# Patient Record
Sex: Female | Born: 1935 | Race: White | Hispanic: No | Smoking: Former smoker
Health system: Southern US, Community
[De-identification: ages and names within clinical notes are randomized; demographics above are authoritative.]

## PROBLEM LIST (undated history)

## (undated) DIAGNOSIS — E785 Hyperlipidemia, unspecified: Secondary | ICD-10-CM

## (undated) DIAGNOSIS — C801 Malignant (primary) neoplasm, unspecified: Secondary | ICD-10-CM

## (undated) DIAGNOSIS — E079 Disorder of thyroid, unspecified: Secondary | ICD-10-CM

## (undated) DIAGNOSIS — F419 Anxiety disorder, unspecified: Secondary | ICD-10-CM

## (undated) DIAGNOSIS — J40 Bronchitis, not specified as acute or chronic: Secondary | ICD-10-CM

## (undated) DIAGNOSIS — F319 Bipolar disorder, unspecified: Secondary | ICD-10-CM

## (undated) DIAGNOSIS — Z950 Presence of cardiac pacemaker: Secondary | ICD-10-CM

## (undated) DIAGNOSIS — D649 Anemia, unspecified: Secondary | ICD-10-CM

## (undated) DIAGNOSIS — F329 Major depressive disorder, single episode, unspecified: Secondary | ICD-10-CM

## (undated) DIAGNOSIS — I442 Atrioventricular block, complete: Secondary | ICD-10-CM

## (undated) DIAGNOSIS — I509 Heart failure, unspecified: Secondary | ICD-10-CM

## (undated) DIAGNOSIS — I10 Essential (primary) hypertension: Secondary | ICD-10-CM

## (undated) DIAGNOSIS — F0390 Unspecified dementia without behavioral disturbance: Secondary | ICD-10-CM

## (undated) DIAGNOSIS — F32A Depression, unspecified: Secondary | ICD-10-CM

## (undated) DIAGNOSIS — T7840XA Allergy, unspecified, initial encounter: Secondary | ICD-10-CM

## (undated) DIAGNOSIS — K219 Gastro-esophageal reflux disease without esophagitis: Secondary | ICD-10-CM

## (undated) DIAGNOSIS — I2510 Atherosclerotic heart disease of native coronary artery without angina pectoris: Secondary | ICD-10-CM

## (undated) HISTORY — DX: Allergy, unspecified, initial encounter: T78.40XA

## (undated) HISTORY — DX: Anemia, unspecified: D64.9

## (undated) HISTORY — DX: Malignant (primary) neoplasm, unspecified: C80.1

## (undated) HISTORY — DX: Hyperlipidemia, unspecified: E78.5

## (undated) HISTORY — PX: MASTECTOMY: SHX3

## (undated) HISTORY — DX: Major depressive disorder, single episode, unspecified: F32.9

## (undated) HISTORY — DX: Atrioventricular block, complete: I44.2

## (undated) HISTORY — DX: Presence of cardiac pacemaker: Z95.0

## (undated) HISTORY — DX: Disorder of thyroid, unspecified: E07.9

## (undated) HISTORY — PX: CORONARY ARTERY BYPASS GRAFT: SHX141

## (undated) HISTORY — PX: BREAST SURGERY: SHX581

## (undated) HISTORY — DX: Essential (primary) hypertension: I10

## (undated) HISTORY — DX: Anxiety disorder, unspecified: F41.9

## (undated) HISTORY — DX: Gastro-esophageal reflux disease without esophagitis: K21.9

## (undated) HISTORY — DX: Bronchitis, not specified as acute or chronic: J40

## (undated) HISTORY — DX: Bipolar disorder, unspecified: F31.9

## (undated) HISTORY — DX: Heart failure, unspecified: I50.9

## (undated) HISTORY — DX: Atherosclerotic heart disease of native coronary artery without angina pectoris: I25.10

## (undated) HISTORY — DX: Unspecified dementia, unspecified severity, without behavioral disturbance, psychotic disturbance, mood disturbance, and anxiety: F03.90

## (undated) HISTORY — PX: CHOLECYSTECTOMY: SHX55

## (undated) HISTORY — DX: Depression, unspecified: F32.A

---

## 1997-07-31 ENCOUNTER — Encounter: Admission: RE | Admit: 1997-07-31 | Discharge: 1997-07-31 | Payer: Self-pay | Admitting: Family Medicine

## 1997-08-18 ENCOUNTER — Encounter: Admission: RE | Admit: 1997-08-18 | Discharge: 1997-08-18 | Payer: Self-pay | Admitting: Family Medicine

## 1997-08-28 ENCOUNTER — Encounter: Admission: RE | Admit: 1997-08-28 | Discharge: 1997-08-28 | Payer: Self-pay | Admitting: Sports Medicine

## 1997-09-10 ENCOUNTER — Encounter: Admission: RE | Admit: 1997-09-10 | Discharge: 1997-09-10 | Payer: Self-pay | Admitting: Family Medicine

## 1998-01-22 ENCOUNTER — Encounter: Admission: RE | Admit: 1998-01-22 | Discharge: 1998-01-22 | Payer: Self-pay | Admitting: Family Medicine

## 1998-04-21 ENCOUNTER — Encounter: Admission: RE | Admit: 1998-04-21 | Discharge: 1998-04-21 | Payer: Self-pay | Admitting: Sports Medicine

## 1998-05-11 ENCOUNTER — Encounter: Admission: RE | Admit: 1998-05-11 | Discharge: 1998-05-11 | Payer: Self-pay | Admitting: Family Medicine

## 1998-05-20 ENCOUNTER — Encounter: Admission: RE | Admit: 1998-05-20 | Discharge: 1998-05-20 | Payer: Self-pay | Admitting: Family Medicine

## 1998-06-10 ENCOUNTER — Encounter: Admission: RE | Admit: 1998-06-10 | Discharge: 1998-06-10 | Payer: Self-pay | Admitting: Family Medicine

## 1998-06-12 ENCOUNTER — Encounter: Admission: RE | Admit: 1998-06-12 | Discharge: 1998-06-12 | Payer: Self-pay | Admitting: Family Medicine

## 1998-06-22 ENCOUNTER — Encounter: Admission: RE | Admit: 1998-06-22 | Discharge: 1998-06-22 | Payer: Self-pay | Admitting: Family Medicine

## 1998-07-21 ENCOUNTER — Encounter: Admission: RE | Admit: 1998-07-21 | Discharge: 1998-07-21 | Payer: Self-pay | Admitting: Family Medicine

## 1998-12-15 ENCOUNTER — Encounter: Admission: RE | Admit: 1998-12-15 | Discharge: 1998-12-15 | Payer: Self-pay | Admitting: Sports Medicine

## 1999-01-13 ENCOUNTER — Other Ambulatory Visit: Admission: RE | Admit: 1999-01-13 | Discharge: 1999-01-13 | Payer: Self-pay | Admitting: Gastroenterology

## 1999-01-13 ENCOUNTER — Encounter (INDEPENDENT_AMBULATORY_CARE_PROVIDER_SITE_OTHER): Payer: Self-pay | Admitting: Specialist

## 1999-01-26 ENCOUNTER — Encounter: Admission: RE | Admit: 1999-01-26 | Discharge: 1999-01-26 | Payer: Self-pay | Admitting: Family Medicine

## 1999-04-30 ENCOUNTER — Encounter: Admission: RE | Admit: 1999-04-30 | Discharge: 1999-04-30 | Payer: Self-pay | Admitting: Family Medicine

## 1999-05-17 ENCOUNTER — Encounter: Admission: RE | Admit: 1999-05-17 | Discharge: 1999-05-17 | Payer: Self-pay | Admitting: Family Medicine

## 1999-05-31 ENCOUNTER — Encounter: Admission: RE | Admit: 1999-05-31 | Discharge: 1999-05-31 | Payer: Self-pay | Admitting: Family Medicine

## 1999-06-07 ENCOUNTER — Encounter: Admission: RE | Admit: 1999-06-07 | Discharge: 1999-06-07 | Payer: Self-pay | Admitting: Family Medicine

## 1999-06-08 ENCOUNTER — Encounter: Payer: Self-pay | Admitting: Sports Medicine

## 1999-06-08 ENCOUNTER — Encounter: Admission: RE | Admit: 1999-06-08 | Discharge: 1999-06-08 | Payer: Self-pay | Admitting: Sports Medicine

## 1999-06-14 ENCOUNTER — Inpatient Hospital Stay (HOSPITAL_COMMUNITY): Admission: AD | Admit: 1999-06-14 | Discharge: 1999-06-15 | Payer: Self-pay | Admitting: Family Medicine

## 1999-06-14 ENCOUNTER — Encounter: Payer: Self-pay | Admitting: Family Medicine

## 1999-06-14 ENCOUNTER — Encounter: Admission: RE | Admit: 1999-06-14 | Discharge: 1999-06-14 | Payer: Self-pay | Admitting: Family Medicine

## 1999-06-23 ENCOUNTER — Encounter: Admission: RE | Admit: 1999-06-23 | Discharge: 1999-06-23 | Payer: Self-pay | Admitting: Family Medicine

## 1999-08-19 ENCOUNTER — Encounter: Admission: RE | Admit: 1999-08-19 | Discharge: 1999-08-19 | Payer: Self-pay | Admitting: Family Medicine

## 1999-08-23 ENCOUNTER — Inpatient Hospital Stay (HOSPITAL_COMMUNITY): Admission: AD | Admit: 1999-08-23 | Discharge: 1999-09-25 | Payer: Self-pay | Admitting: Family Medicine

## 1999-08-23 ENCOUNTER — Encounter: Admission: RE | Admit: 1999-08-23 | Discharge: 1999-08-23 | Payer: Self-pay | Admitting: Family Medicine

## 1999-08-23 ENCOUNTER — Encounter: Payer: Self-pay | Admitting: *Deleted

## 1999-08-25 ENCOUNTER — Encounter: Payer: Self-pay | Admitting: Family Medicine

## 1999-08-26 ENCOUNTER — Encounter: Payer: Self-pay | Admitting: Family Medicine

## 1999-08-27 ENCOUNTER — Encounter: Payer: Self-pay | Admitting: Family Medicine

## 1999-08-28 ENCOUNTER — Encounter: Payer: Self-pay | Admitting: Pulmonary Disease

## 1999-08-29 ENCOUNTER — Encounter: Payer: Self-pay | Admitting: Pulmonary Disease

## 1999-08-30 ENCOUNTER — Encounter: Payer: Self-pay | Admitting: Family Medicine

## 1999-08-31 ENCOUNTER — Encounter: Payer: Self-pay | Admitting: Family Medicine

## 1999-09-03 ENCOUNTER — Encounter: Payer: Self-pay | Admitting: Family Medicine

## 1999-09-04 ENCOUNTER — Encounter: Payer: Self-pay | Admitting: Family Medicine

## 1999-09-05 ENCOUNTER — Encounter: Payer: Self-pay | Admitting: Family Medicine

## 1999-09-06 ENCOUNTER — Ambulatory Visit (HOSPITAL_COMMUNITY): Admission: RE | Admit: 1999-09-06 | Discharge: 1999-09-06 | Payer: Self-pay | Admitting: Psychiatry

## 1999-09-08 ENCOUNTER — Encounter: Payer: Self-pay | Admitting: Family Medicine

## 1999-09-13 ENCOUNTER — Encounter: Payer: Self-pay | Admitting: Family Medicine

## 1999-09-14 ENCOUNTER — Encounter: Payer: Self-pay | Admitting: Family Medicine

## 1999-09-17 ENCOUNTER — Encounter: Payer: Self-pay | Admitting: Family Medicine

## 1999-09-22 ENCOUNTER — Encounter: Payer: Self-pay | Admitting: Family Medicine

## 1999-09-25 ENCOUNTER — Inpatient Hospital Stay: Admission: RE | Admit: 1999-09-25 | Discharge: 1999-10-01 | Payer: Self-pay | Admitting: Family Medicine

## 1999-09-26 ENCOUNTER — Encounter: Payer: Self-pay | Admitting: Family Medicine

## 1999-09-27 ENCOUNTER — Ambulatory Visit (HOSPITAL_COMMUNITY): Admission: RE | Admit: 1999-09-27 | Discharge: 1999-09-27 | Payer: Self-pay | Admitting: Family Medicine

## 1999-09-27 ENCOUNTER — Encounter: Payer: Self-pay | Admitting: Family Medicine

## 1999-10-15 ENCOUNTER — Encounter: Admission: RE | Admit: 1999-10-15 | Discharge: 1999-10-15 | Payer: Self-pay | Admitting: Family Medicine

## 1999-10-19 ENCOUNTER — Ambulatory Visit: Admission: RE | Admit: 1999-10-19 | Discharge: 1999-10-19 | Payer: Self-pay | Admitting: Gynecology

## 1999-10-22 ENCOUNTER — Ambulatory Visit (HOSPITAL_COMMUNITY): Admission: RE | Admit: 1999-10-22 | Discharge: 1999-10-22 | Payer: Self-pay | Admitting: Gynecology

## 2000-01-03 ENCOUNTER — Encounter: Admission: RE | Admit: 2000-01-03 | Discharge: 2000-01-03 | Payer: Self-pay | Admitting: Family Medicine

## 2000-03-15 ENCOUNTER — Encounter: Admission: RE | Admit: 2000-03-15 | Discharge: 2000-03-15 | Payer: Self-pay | Admitting: Family Medicine

## 2000-03-23 ENCOUNTER — Encounter: Payer: Self-pay | Admitting: Sports Medicine

## 2000-03-23 ENCOUNTER — Encounter: Admission: RE | Admit: 2000-03-23 | Discharge: 2000-03-23 | Payer: Self-pay | Admitting: Sports Medicine

## 2000-08-23 ENCOUNTER — Encounter: Admission: RE | Admit: 2000-08-23 | Discharge: 2000-08-23 | Payer: Self-pay | Admitting: Family Medicine

## 2000-09-06 ENCOUNTER — Encounter: Admission: RE | Admit: 2000-09-06 | Discharge: 2000-09-06 | Payer: Self-pay | Admitting: Family Medicine

## 2000-09-06 ENCOUNTER — Encounter: Payer: Self-pay | Admitting: Sports Medicine

## 2000-09-21 ENCOUNTER — Encounter: Admission: RE | Admit: 2000-09-21 | Discharge: 2000-09-21 | Payer: Self-pay | Admitting: Family Medicine

## 2000-09-25 ENCOUNTER — Encounter: Admission: RE | Admit: 2000-09-25 | Discharge: 2000-09-25 | Payer: Self-pay | Admitting: Sports Medicine

## 2000-09-25 ENCOUNTER — Encounter: Payer: Self-pay | Admitting: Sports Medicine

## 2000-10-19 ENCOUNTER — Encounter: Admission: RE | Admit: 2000-10-19 | Discharge: 2000-10-19 | Payer: Self-pay | Admitting: Family Medicine

## 2001-01-22 ENCOUNTER — Encounter: Admission: RE | Admit: 2001-01-22 | Discharge: 2001-01-22 | Payer: Self-pay | Admitting: Sports Medicine

## 2001-08-06 ENCOUNTER — Encounter: Admission: RE | Admit: 2001-08-06 | Discharge: 2001-08-06 | Payer: Self-pay | Admitting: Sports Medicine

## 2001-08-09 ENCOUNTER — Encounter: Payer: Self-pay | Admitting: Sports Medicine

## 2001-08-09 ENCOUNTER — Encounter: Admission: RE | Admit: 2001-08-09 | Discharge: 2001-08-09 | Payer: Self-pay | Admitting: Sports Medicine

## 2001-09-06 ENCOUNTER — Encounter: Admission: RE | Admit: 2001-09-06 | Discharge: 2001-09-06 | Payer: Self-pay | Admitting: Sports Medicine

## 2002-01-18 ENCOUNTER — Encounter: Admission: RE | Admit: 2002-01-18 | Discharge: 2002-01-18 | Payer: Self-pay | Admitting: Family Medicine

## 2002-05-15 ENCOUNTER — Encounter: Admission: RE | Admit: 2002-05-15 | Discharge: 2002-05-15 | Payer: Self-pay | Admitting: Family Medicine

## 2002-05-20 ENCOUNTER — Encounter: Admission: RE | Admit: 2002-05-20 | Discharge: 2002-05-20 | Payer: Self-pay | Admitting: Sports Medicine

## 2002-05-20 ENCOUNTER — Encounter: Payer: Self-pay | Admitting: Sports Medicine

## 2002-09-23 ENCOUNTER — Encounter: Admission: RE | Admit: 2002-09-23 | Discharge: 2002-09-23 | Payer: Self-pay | Admitting: Family Medicine

## 2002-10-18 ENCOUNTER — Encounter: Admission: RE | Admit: 2002-10-18 | Discharge: 2002-10-18 | Payer: Self-pay | Admitting: Family Medicine

## 2003-01-03 ENCOUNTER — Encounter: Admission: RE | Admit: 2003-01-03 | Discharge: 2003-01-03 | Payer: Self-pay | Admitting: Sports Medicine

## 2003-01-27 ENCOUNTER — Encounter: Admission: RE | Admit: 2003-01-27 | Discharge: 2003-01-27 | Payer: Self-pay | Admitting: Family Medicine

## 2003-03-28 ENCOUNTER — Encounter: Admission: RE | Admit: 2003-03-28 | Discharge: 2003-03-28 | Payer: Self-pay | Admitting: Family Medicine

## 2003-04-11 ENCOUNTER — Inpatient Hospital Stay (HOSPITAL_COMMUNITY): Admission: EM | Admit: 2003-04-11 | Discharge: 2003-04-19 | Payer: Self-pay | Admitting: Emergency Medicine

## 2003-04-14 ENCOUNTER — Encounter: Payer: Self-pay | Admitting: Internal Medicine

## 2003-09-22 ENCOUNTER — Encounter: Admission: RE | Admit: 2003-09-22 | Discharge: 2003-09-22 | Payer: Self-pay | Admitting: Family Medicine

## 2004-01-06 ENCOUNTER — Ambulatory Visit: Payer: Self-pay | Admitting: Family Medicine

## 2004-01-23 ENCOUNTER — Ambulatory Visit: Payer: Self-pay | Admitting: Family Medicine

## 2004-06-02 ENCOUNTER — Ambulatory Visit: Payer: Self-pay | Admitting: Family Medicine

## 2004-07-21 ENCOUNTER — Ambulatory Visit: Payer: Self-pay | Admitting: Internal Medicine

## 2004-09-20 ENCOUNTER — Ambulatory Visit: Payer: Self-pay

## 2004-10-05 ENCOUNTER — Ambulatory Visit: Payer: Self-pay | Admitting: Sports Medicine

## 2004-11-26 ENCOUNTER — Ambulatory Visit: Payer: Self-pay | Admitting: Cardiovascular Disease

## 2004-12-27 ENCOUNTER — Ambulatory Visit: Payer: Self-pay | Admitting: Family Medicine

## 2005-02-07 ENCOUNTER — Ambulatory Visit: Payer: Self-pay | Admitting: Internal Medicine

## 2005-02-18 ENCOUNTER — Ambulatory Visit: Payer: Self-pay | Admitting: Family Medicine

## 2005-05-27 ENCOUNTER — Ambulatory Visit: Payer: Self-pay | Admitting: Cardiovascular Disease

## 2005-08-17 ENCOUNTER — Ambulatory Visit: Payer: Self-pay | Admitting: Internal Medicine

## 2005-11-16 ENCOUNTER — Ambulatory Visit: Payer: Self-pay | Admitting: Internal Medicine

## 2005-12-30 ENCOUNTER — Ambulatory Visit: Payer: Self-pay | Admitting: Family Medicine

## 2006-05-11 DIAGNOSIS — F339 Major depressive disorder, recurrent, unspecified: Secondary | ICD-10-CM | POA: Insufficient documentation

## 2006-05-11 DIAGNOSIS — F319 Bipolar disorder, unspecified: Secondary | ICD-10-CM | POA: Insufficient documentation

## 2006-05-11 DIAGNOSIS — I442 Atrioventricular block, complete: Secondary | ICD-10-CM

## 2006-05-11 DIAGNOSIS — N83209 Unspecified ovarian cyst, unspecified side: Secondary | ICD-10-CM

## 2006-05-11 DIAGNOSIS — E039 Hypothyroidism, unspecified: Secondary | ICD-10-CM | POA: Insufficient documentation

## 2006-05-11 DIAGNOSIS — F411 Generalized anxiety disorder: Secondary | ICD-10-CM | POA: Insufficient documentation

## 2006-05-11 DIAGNOSIS — F0150 Vascular dementia without behavioral disturbance: Secondary | ICD-10-CM

## 2006-05-11 DIAGNOSIS — I739 Peripheral vascular disease, unspecified: Secondary | ICD-10-CM

## 2006-05-11 DIAGNOSIS — K649 Unspecified hemorrhoids: Secondary | ICD-10-CM | POA: Insufficient documentation

## 2006-05-11 DIAGNOSIS — R259 Unspecified abnormal involuntary movements: Secondary | ICD-10-CM | POA: Insufficient documentation

## 2006-05-11 DIAGNOSIS — G459 Transient cerebral ischemic attack, unspecified: Secondary | ICD-10-CM | POA: Insufficient documentation

## 2006-05-11 DIAGNOSIS — I25810 Atherosclerosis of coronary artery bypass graft(s) without angina pectoris: Secondary | ICD-10-CM

## 2006-05-31 ENCOUNTER — Ambulatory Visit: Payer: Self-pay | Admitting: Cardiovascular Disease

## 2006-06-22 ENCOUNTER — Encounter (INDEPENDENT_AMBULATORY_CARE_PROVIDER_SITE_OTHER): Payer: Self-pay | Admitting: Family Medicine

## 2006-07-05 ENCOUNTER — Telehealth: Payer: Self-pay | Admitting: *Deleted

## 2006-08-08 ENCOUNTER — Telehealth: Payer: Self-pay | Admitting: *Deleted

## 2006-08-14 ENCOUNTER — Telehealth (INDEPENDENT_AMBULATORY_CARE_PROVIDER_SITE_OTHER): Payer: Self-pay | Admitting: *Deleted

## 2006-08-14 ENCOUNTER — Ambulatory Visit: Payer: Self-pay | Admitting: Sports Medicine

## 2006-09-12 ENCOUNTER — Ambulatory Visit: Payer: Self-pay | Admitting: Internal Medicine

## 2006-10-16 ENCOUNTER — Encounter (INDEPENDENT_AMBULATORY_CARE_PROVIDER_SITE_OTHER): Payer: Self-pay | Admitting: Family Medicine

## 2006-11-03 ENCOUNTER — Encounter (INDEPENDENT_AMBULATORY_CARE_PROVIDER_SITE_OTHER): Payer: Self-pay | Admitting: Family Medicine

## 2006-11-16 ENCOUNTER — Encounter (INDEPENDENT_AMBULATORY_CARE_PROVIDER_SITE_OTHER): Payer: Self-pay | Admitting: Family Medicine

## 2006-11-28 ENCOUNTER — Telehealth: Payer: Self-pay | Admitting: *Deleted

## 2006-11-29 ENCOUNTER — Encounter (INDEPENDENT_AMBULATORY_CARE_PROVIDER_SITE_OTHER): Payer: Self-pay | Admitting: Family Medicine

## 2006-11-29 ENCOUNTER — Ambulatory Visit: Payer: Self-pay | Admitting: Family Medicine

## 2006-11-29 DIAGNOSIS — I10 Essential (primary) hypertension: Secondary | ICD-10-CM

## 2006-11-29 LAB — CONVERTED CEMR LAB
BUN: 17 mg/dL (ref 6–23)
CO2: 26 meq/L (ref 19–32)
Calcium: 9.4 mg/dL (ref 8.4–10.5)
Chloride: 106 meq/L (ref 96–112)
Creatinine, Ser: 1.05 mg/dL (ref 0.40–1.20)
HCT: 42.9 % (ref 36.0–46.0)
Hemoglobin: 14.2 g/dL (ref 12.0–15.0)
MCV: 98.4 fL (ref 78.0–100.0)
Platelets: 217 10*3/uL (ref 150–400)
RDW: 13.6 % (ref 11.5–14.0)
TSH: 3.125 microintl units/mL (ref 0.350–5.50)
Total Bilirubin: 0.4 mg/dL (ref 0.3–1.2)
WBC: 8.2 10*3/uL (ref 4.0–10.5)

## 2006-11-30 ENCOUNTER — Encounter (INDEPENDENT_AMBULATORY_CARE_PROVIDER_SITE_OTHER): Payer: Self-pay | Admitting: Family Medicine

## 2006-12-04 ENCOUNTER — Encounter: Admission: RE | Admit: 2006-12-04 | Discharge: 2006-12-04 | Payer: Self-pay | Admitting: Sports Medicine

## 2006-12-04 ENCOUNTER — Encounter (INDEPENDENT_AMBULATORY_CARE_PROVIDER_SITE_OTHER): Payer: Self-pay | Admitting: Family Medicine

## 2006-12-08 ENCOUNTER — Ambulatory Visit: Payer: Self-pay | Admitting: Cardiovascular Disease

## 2006-12-14 ENCOUNTER — Ambulatory Visit: Payer: Self-pay | Admitting: Internal Medicine

## 2006-12-15 ENCOUNTER — Encounter (INDEPENDENT_AMBULATORY_CARE_PROVIDER_SITE_OTHER): Payer: Self-pay | Admitting: Family Medicine

## 2006-12-26 ENCOUNTER — Encounter (INDEPENDENT_AMBULATORY_CARE_PROVIDER_SITE_OTHER): Payer: Self-pay | Admitting: Family Medicine

## 2007-02-05 ENCOUNTER — Ambulatory Visit: Payer: Self-pay | Admitting: Family Medicine

## 2007-03-27 ENCOUNTER — Telehealth (INDEPENDENT_AMBULATORY_CARE_PROVIDER_SITE_OTHER): Payer: Self-pay | Admitting: Family Medicine

## 2007-03-28 ENCOUNTER — Telehealth: Payer: Self-pay | Admitting: *Deleted

## 2007-03-29 ENCOUNTER — Ambulatory Visit: Payer: Self-pay | Admitting: Family Medicine

## 2007-03-29 ENCOUNTER — Encounter (INDEPENDENT_AMBULATORY_CARE_PROVIDER_SITE_OTHER): Payer: Self-pay | Admitting: Family Medicine

## 2007-03-29 LAB — CONVERTED CEMR LAB
ALT: 16 units/L (ref 0–35)
Albumin: 4 g/dL (ref 3.5–5.2)
CO2: 24 meq/L (ref 19–32)
Calcium: 9.2 mg/dL (ref 8.4–10.5)
Chloride: 105 meq/L (ref 96–112)
Glucose, Bld: 90 mg/dL (ref 70–99)
Hgb A1c MFr Bld: 5.4 %
Platelets: 196 10*3/uL (ref 150–400)
Potassium: 3.9 meq/L (ref 3.5–5.3)
RBC: 4.13 M/uL (ref 3.87–5.11)
Sodium: 142 meq/L (ref 135–145)
Total Protein: 7.2 g/dL (ref 6.0–8.3)
WBC: 8.3 10*3/uL (ref 4.0–10.5)

## 2007-04-02 ENCOUNTER — Ambulatory Visit: Payer: Self-pay | Admitting: Internal Medicine

## 2007-04-30 ENCOUNTER — Telehealth: Payer: Self-pay | Admitting: *Deleted

## 2007-05-21 ENCOUNTER — Ambulatory Visit: Payer: Self-pay

## 2007-05-24 ENCOUNTER — Telehealth (INDEPENDENT_AMBULATORY_CARE_PROVIDER_SITE_OTHER): Payer: Self-pay | Admitting: Family Medicine

## 2007-06-04 ENCOUNTER — Encounter: Payer: Self-pay | Admitting: Family Medicine

## 2007-06-08 ENCOUNTER — Telehealth (INDEPENDENT_AMBULATORY_CARE_PROVIDER_SITE_OTHER): Payer: Self-pay | Admitting: Family Medicine

## 2007-06-11 ENCOUNTER — Encounter (INDEPENDENT_AMBULATORY_CARE_PROVIDER_SITE_OTHER): Payer: Self-pay | Admitting: Family Medicine

## 2007-06-13 ENCOUNTER — Encounter (INDEPENDENT_AMBULATORY_CARE_PROVIDER_SITE_OTHER): Payer: Self-pay | Admitting: Family Medicine

## 2007-07-05 ENCOUNTER — Encounter: Payer: Self-pay | Admitting: *Deleted

## 2007-07-09 ENCOUNTER — Ambulatory Visit: Payer: Self-pay | Admitting: Cardiovascular Disease

## 2007-07-20 ENCOUNTER — Ambulatory Visit: Payer: Self-pay | Admitting: Family Medicine

## 2007-07-20 LAB — CONVERTED CEMR LAB: Hgb A1c MFr Bld: 5.5 %

## 2007-07-23 ENCOUNTER — Telehealth (INDEPENDENT_AMBULATORY_CARE_PROVIDER_SITE_OTHER): Payer: Self-pay | Admitting: Family Medicine

## 2007-08-28 ENCOUNTER — Encounter (INDEPENDENT_AMBULATORY_CARE_PROVIDER_SITE_OTHER): Payer: Self-pay | Admitting: Family Medicine

## 2007-08-30 ENCOUNTER — Ambulatory Visit: Payer: Self-pay | Admitting: Internal Medicine

## 2007-09-24 ENCOUNTER — Encounter: Payer: Self-pay | Admitting: Family Medicine

## 2007-10-23 ENCOUNTER — Encounter: Payer: Self-pay | Admitting: Family Medicine

## 2007-10-23 ENCOUNTER — Ambulatory Visit: Payer: Self-pay | Admitting: Family Medicine

## 2007-10-23 LAB — CONVERTED CEMR LAB
AST: 24 units/L (ref 0–37)
Alkaline Phosphatase: 102 units/L (ref 39–117)
BUN: 20 mg/dL (ref 6–23)
Calcium: 9.1 mg/dL (ref 8.4–10.5)
Chloride: 106 meq/L (ref 96–112)
Creatinine, Ser: 1.2 mg/dL (ref 0.40–1.20)
Total Bilirubin: 0.3 mg/dL (ref 0.3–1.2)

## 2007-10-24 ENCOUNTER — Encounter: Payer: Self-pay | Admitting: Family Medicine

## 2007-12-06 ENCOUNTER — Ambulatory Visit: Payer: Self-pay | Admitting: Internal Medicine

## 2007-12-17 ENCOUNTER — Telehealth: Payer: Self-pay | Admitting: Family Medicine

## 2007-12-21 ENCOUNTER — Ambulatory Visit: Payer: Self-pay | Admitting: Family Medicine

## 2008-01-07 ENCOUNTER — Ambulatory Visit: Payer: Self-pay | Admitting: Cardiovascular Disease

## 2008-01-09 ENCOUNTER — Encounter: Payer: Self-pay | Admitting: Family Medicine

## 2008-01-30 ENCOUNTER — Encounter: Payer: Self-pay | Admitting: Family Medicine

## 2008-02-27 ENCOUNTER — Encounter: Payer: Self-pay | Admitting: Family Medicine

## 2008-03-12 ENCOUNTER — Ambulatory Visit: Payer: Self-pay | Admitting: Internal Medicine

## 2008-03-31 ENCOUNTER — Telehealth: Payer: Self-pay | Admitting: *Deleted

## 2008-04-02 ENCOUNTER — Ambulatory Visit: Payer: Self-pay | Admitting: Family Medicine

## 2008-04-08 ENCOUNTER — Telehealth: Payer: Self-pay | Admitting: *Deleted

## 2008-04-09 ENCOUNTER — Encounter: Payer: Self-pay | Admitting: Family Medicine

## 2008-04-09 LAB — CONVERTED CEMR LAB
BUN: 14 mg/dL
CO2: 32 meq/L
Chloride: 105 meq/L
Potassium: 4.1 meq/L

## 2008-04-10 ENCOUNTER — Encounter: Payer: Self-pay | Admitting: Family Medicine

## 2008-04-22 ENCOUNTER — Encounter: Payer: Self-pay | Admitting: Family Medicine

## 2008-04-22 ENCOUNTER — Telehealth: Payer: Self-pay | Admitting: Family Medicine

## 2008-04-30 ENCOUNTER — Ambulatory Visit: Payer: Self-pay | Admitting: Family Medicine

## 2008-05-06 ENCOUNTER — Encounter: Payer: Self-pay | Admitting: Cardiovascular Disease

## 2008-05-08 ENCOUNTER — Encounter: Payer: Self-pay | Admitting: Family Medicine

## 2008-05-08 ENCOUNTER — Ambulatory Visit: Payer: Self-pay | Admitting: Internal Medicine

## 2008-05-08 LAB — CONVERTED CEMR LAB
CO2: 30 meq/L
Calcium: 9.5 mg/dL
Glucose, Bld: 127 mg/dL
Potassium: 3.8 meq/L
Sodium: 142 meq/L

## 2008-05-09 ENCOUNTER — Encounter: Payer: Self-pay | Admitting: Family Medicine

## 2008-05-15 ENCOUNTER — Telehealth: Payer: Self-pay | Admitting: *Deleted

## 2008-05-15 ENCOUNTER — Encounter: Payer: Self-pay | Admitting: Family Medicine

## 2008-05-20 ENCOUNTER — Encounter: Payer: Self-pay | Admitting: Family Medicine

## 2008-05-22 ENCOUNTER — Telehealth: Payer: Self-pay | Admitting: Family Medicine

## 2008-06-17 ENCOUNTER — Encounter: Payer: Self-pay | Admitting: Family Medicine

## 2008-06-17 LAB — CONVERTED CEMR LAB
BUN: 18 mg/dL
CO2: 27 meq/L
Chloride: 106 meq/L
Glucose, Bld: 106 mg/dL
Potassium: 3.7 meq/L

## 2008-06-18 ENCOUNTER — Encounter: Payer: Self-pay | Admitting: Family Medicine

## 2008-07-02 ENCOUNTER — Encounter (INDEPENDENT_AMBULATORY_CARE_PROVIDER_SITE_OTHER): Payer: Self-pay | Admitting: *Deleted

## 2008-07-24 ENCOUNTER — Encounter: Payer: Self-pay | Admitting: Family Medicine

## 2008-07-24 LAB — CONVERTED CEMR LAB
BUN: 28 mg/dL
Calcium: 9.7 mg/dL
Glucose, Bld: 73 mg/dL

## 2008-07-28 ENCOUNTER — Encounter: Payer: Self-pay | Admitting: Family Medicine

## 2008-07-31 DIAGNOSIS — K219 Gastro-esophageal reflux disease without esophagitis: Secondary | ICD-10-CM

## 2008-07-31 DIAGNOSIS — E785 Hyperlipidemia, unspecified: Secondary | ICD-10-CM | POA: Insufficient documentation

## 2008-08-06 ENCOUNTER — Ambulatory Visit: Payer: Self-pay | Admitting: Internal Medicine

## 2008-08-19 ENCOUNTER — Encounter: Payer: Self-pay | Admitting: *Deleted

## 2008-08-19 LAB — CONVERTED CEMR LAB
Calcium: 9.7 mg/dL
Glucose, Bld: 81 mg/dL
Sodium: 141 meq/L

## 2008-08-21 ENCOUNTER — Encounter: Payer: Self-pay | Admitting: *Deleted

## 2008-10-01 ENCOUNTER — Encounter (INDEPENDENT_AMBULATORY_CARE_PROVIDER_SITE_OTHER): Payer: Self-pay | Admitting: *Deleted

## 2008-11-04 ENCOUNTER — Ambulatory Visit: Payer: Self-pay | Admitting: Internal Medicine

## 2008-12-19 ENCOUNTER — Encounter: Payer: Self-pay | Admitting: Family Medicine

## 2008-12-19 ENCOUNTER — Ambulatory Visit: Payer: Self-pay | Admitting: Family Medicine

## 2008-12-19 DIAGNOSIS — R1084 Generalized abdominal pain: Secondary | ICD-10-CM

## 2008-12-19 LAB — CONVERTED CEMR LAB: Hgb A1c MFr Bld: 5.6 %

## 2008-12-22 ENCOUNTER — Encounter: Payer: Self-pay | Admitting: Family Medicine

## 2008-12-22 DIAGNOSIS — D539 Nutritional anemia, unspecified: Secondary | ICD-10-CM | POA: Insufficient documentation

## 2008-12-22 LAB — CONVERTED CEMR LAB
ALT: 23 units/L (ref 0–35)
AST: 26 units/L (ref 0–37)
Albumin: 3.8 g/dL (ref 3.5–5.2)
Alkaline Phosphatase: 78 units/L (ref 39–117)
Basophils Absolute: 0 10*3/uL (ref 0.0–0.1)
Glucose, Bld: 87 mg/dL (ref 70–99)
Hemoglobin: 12.9 g/dL (ref 12.0–15.0)
Lymphocytes Relative: 29 % (ref 12–46)
Monocytes Absolute: 0.8 10*3/uL (ref 0.1–1.0)
Neutro Abs: 3.8 10*3/uL (ref 1.7–7.7)
Potassium: 4.7 meq/L (ref 3.5–5.3)
RBC: 3.87 M/uL (ref 3.87–5.11)
RDW: 13.2 % (ref 11.5–15.5)
Sodium: 142 meq/L (ref 135–145)
Total Protein: 7.1 g/dL (ref 6.0–8.3)

## 2008-12-25 ENCOUNTER — Encounter: Payer: Self-pay | Admitting: Family Medicine

## 2008-12-29 ENCOUNTER — Ambulatory Visit: Payer: Self-pay | Admitting: Cardiovascular Disease

## 2009-02-06 ENCOUNTER — Encounter: Payer: Self-pay | Admitting: Internal Medicine

## 2009-02-16 ENCOUNTER — Encounter: Payer: Self-pay | Admitting: Internal Medicine

## 2009-02-24 ENCOUNTER — Encounter: Payer: Self-pay | Admitting: Internal Medicine

## 2009-04-03 ENCOUNTER — Encounter: Payer: Self-pay | Admitting: *Deleted

## 2009-05-22 ENCOUNTER — Encounter (INDEPENDENT_AMBULATORY_CARE_PROVIDER_SITE_OTHER): Payer: Self-pay | Admitting: *Deleted

## 2009-05-25 ENCOUNTER — Ambulatory Visit: Payer: Self-pay | Admitting: Internal Medicine

## 2009-06-01 ENCOUNTER — Encounter: Payer: Self-pay | Admitting: Internal Medicine

## 2009-06-09 ENCOUNTER — Telehealth: Payer: Self-pay | Admitting: Family Medicine

## 2009-08-26 ENCOUNTER — Ambulatory Visit: Payer: Self-pay | Admitting: Internal Medicine

## 2009-09-24 ENCOUNTER — Encounter: Payer: Self-pay | Admitting: Internal Medicine

## 2009-10-14 ENCOUNTER — Telehealth: Payer: Self-pay | Admitting: Family Medicine

## 2009-11-17 ENCOUNTER — Telehealth: Payer: Self-pay | Admitting: *Deleted

## 2009-11-18 ENCOUNTER — Ambulatory Visit: Payer: Self-pay | Admitting: Family Medicine

## 2009-11-18 ENCOUNTER — Encounter: Payer: Self-pay | Admitting: Family Medicine

## 2009-11-19 ENCOUNTER — Encounter: Payer: Self-pay | Admitting: Family Medicine

## 2009-11-19 LAB — CONVERTED CEMR LAB
Alkaline Phosphatase: 71 units/L (ref 39–117)
BUN: 19 mg/dL (ref 6–23)
CO2: 30 meq/L (ref 19–32)
Direct LDL: 126 mg/dL — ABNORMAL HIGH
Glucose, Bld: 89 mg/dL (ref 70–99)
HCT: 41.3 % (ref 36.0–46.0)
Hemoglobin: 13.3 g/dL (ref 12.0–15.0)
MCHC: 32.2 g/dL (ref 30.0–36.0)
MCV: 103 fL — ABNORMAL HIGH (ref 78.0–100.0)
RBC: 4.01 M/uL (ref 3.87–5.11)
TSH: 1.293 microintl units/mL (ref 0.350–4.500)
Total Bilirubin: 0.3 mg/dL (ref 0.3–1.2)

## 2009-12-22 ENCOUNTER — Ambulatory Visit: Payer: Self-pay | Admitting: Internal Medicine

## 2009-12-23 LAB — CONVERTED CEMR LAB: Pro B Natriuretic peptide (BNP): 128.5 pg/mL — ABNORMAL HIGH (ref 0.0–100.0)

## 2010-01-01 ENCOUNTER — Ambulatory Visit (HOSPITAL_COMMUNITY): Admission: RE | Admit: 2010-01-01 | Discharge: 2010-01-01 | Payer: Self-pay | Admitting: Cardiovascular Disease

## 2010-01-01 ENCOUNTER — Ambulatory Visit: Payer: Self-pay | Admitting: Cardiovascular Disease

## 2010-01-01 ENCOUNTER — Encounter: Payer: Self-pay | Admitting: Cardiovascular Disease

## 2010-01-01 ENCOUNTER — Ambulatory Visit: Payer: Self-pay

## 2010-03-26 ENCOUNTER — Encounter (INDEPENDENT_AMBULATORY_CARE_PROVIDER_SITE_OTHER): Payer: Self-pay | Admitting: *Deleted

## 2010-04-12 ENCOUNTER — Encounter: Payer: Self-pay | Admitting: Internal Medicine

## 2010-04-12 ENCOUNTER — Ambulatory Visit
Admission: RE | Admit: 2010-04-12 | Discharge: 2010-04-12 | Payer: Self-pay | Source: Home / Self Care | Attending: Internal Medicine | Admitting: Internal Medicine

## 2010-04-13 NOTE — Letter (Signed)
Summary: Remote Device Check  [employer], Main Office  [address]   [place], [place] [address]   Phone: [phone]  Fax: [phone]     June 01, 2009 MRN: [id number]   [facility] [address] [place], [place]  [address]   Dear Ms. Mahmood,   Your remote transmission was recieved and reviewed by your physician.  All diagnostics were within normal limits for you.  __X___Your next transmission is scheduled for:    August 26, 2009.  Please transmit at any time this day.  If you have a wireless device your transmission will be sent automatically.     Sincerely,  Proofreader

## 2010-04-13 NOTE — Progress Notes (Signed)
Summary: Rx Req   Phone Note Refill Request Call back at [phone] Message from:  Diane -daughter  Refills Requested: Medication #1:  ARICEPT 10 MG  TABS 1 TAB by mouth QHS [facility] [address]  Initial call taken by: Clydell Hakim,  October 14, 2009 8:36 AM  Follow-up for Phone Call        called [facility] and they never received any of the electronic refills sent. gave Rx verbally today Aricept ( generic ) 10 mg one tablet at bedtime # 32 tabs no refill.Marland Kitchen advised pharmacist to let patient know that she needs appointment before further refills.  Follow-up by: Theresia Lo RN,  October 14, 2009 9:30 AM

## 2010-04-13 NOTE — Letter (Signed)
Summary: Remote Device Check  [employer], Main Office  [address]   [place], [place] [address]   Phone: [phone]  Fax: [phone]     September 24, 2009 MRN: [id number]   [facility] [address] [place], [place]  [address]   Dear Ms. Verma,   Your remote transmission was recieved and reviewed by your physician.  All diagnostics were within normal limits for you.   ___X___Your next office visit is scheduled for:  August 2011 w/Dr Graciela Husbands. Please call our office to schedule an appointment.    Sincerely,  Vella Kohler

## 2010-04-13 NOTE — Cardiovascular Report (Signed)
Summary: Office Visit Remote   Office Visit Remote   Imported By: Roderic Ovens 06/01/2009 14:04:27  _____________________________________________________________________  External Attachment:    Type:   Image     Comment:   External Document

## 2010-04-13 NOTE — Cardiovascular Report (Signed)
Summary: Office Visit Remote   Office Visit Remote   Imported By: Roderic Ovens 09/25/2009 16:14:16  _____________________________________________________________________  External Attachment:    Type:   Image     Comment:   External Document

## 2010-04-13 NOTE — Progress Notes (Signed)
   called dtr about her mom needing an appt before we can fill more meds. she states she had one coming up in a few weeks. asked her to bring her in today or tomorrow. she will have her here at 11am tomorrow per dtr's request..Sally Caldwell RN  November 17, 2009 10:49 AM
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
## 2010-04-13 NOTE — Letter (Signed)
Summary: Generic Letter  [name]  [address]   [place], [place] [address]   Phone: [phone]  Fax: [phone]    11/19/2009  [facility] [address] [place], [place]  [address]  Dear Ms. Zelada,   Your lab results looked good.  Your cholesterol and thyroid were normal.  You should definatelty continue taking the B12 and should take a multi-vitamin daily.         Sincerely,   Antoine Primas DO

## 2010-04-13 NOTE — Assessment & Plan Note (Signed)
Summary: [employer]/SAF  Medications Added FUROSEMIDE 40 MG TABS (FUROSEMIDE) 1  tab by mouth daily        Visit Type:  Follow-up Referring Provider:  Burna Forts Primary Provider:  Antoine Primas DO   History of Present Illness: [name] is seen in followup for pacemaker implanted for complete heart block in 2005. This was associated with syncope. She has had no recurrent syncope.  She has a history of ischemic heart disease with prior bypass surgery. She had no problems with chest pain.. There has been no peripheral edema;  there is no nocturnal dyspnea.  She has been noted to have more shortness of breath with exertion.    Current Medications (verified): 1)  Enalapril Maleate 5 Mg  Tabs (Enalapril Maleate) .Marland Kitchen.. 1 Tab By Mouth Two Times A Day 2)  Synthroid 25 Mcg  Tabs (Levothyroxine Sodium) .Marland Kitchen.. 1 Tab By Mouth Daily. 3)  Aricept 10 Mg  Tabs (Donepezil Hcl) .Marland Kitchen.. 1 Tab By Mouth Qhs 4)  Abilify 15 Mg Tabs (Aripiprazole) .... Take 1 Tablet By Mouth Once A Day 5)  Furosemide 40 Mg Tabs (Furosemide) .Marland Kitchen.. 1  Tab By Mouth Daily 6)  Klor-Con M20 20 Meq Tbcr (Potassium Chloride Crys Cr) .... Take 1 Tablet By Mouth Once A Day 7)  Pentoxifylline Cr 400 Mg Tbcr (Pentoxifylline) .... One Tab By Mouth Tid 8)  Propranolol Hcl 10 Mg Tabs (Propranolol Hcl) .Marland Kitchen.. 1 Tab By Mouth Three Times A Day. 9)  Simvastatin 40 Mg Tabs (Simvastatin) .... Take 1 Tablet Once A Day. 10)  Wellbutrin Xl 150 Mg Tb24 (Bupropion Hcl) .... Take 3 Tablet By Mouth Every Morning 11)  Trihexyphenidyl Hcl 2 Mg  Tabs (Trihexyphenidyl Hcl) .Marland Kitchen.. 1 Tab By Mouth Qd 12)  Prilosec Otc 20 Mg  Tbec (Omeprazole Magnesium) .Marland Kitchen.. 1 Tab By Mouth Twice Daily 13)  Lorazepam 0.5 Mg  Tabs (Lorazepam) .Marland Kitchen.. 1 By Mouth Two Times A Day 14)  Sertraline Hcl 100 Mg  Tabs (Sertraline Hcl) .Marland Kitchen.. 1 By Mouth Qam 15)  Nasonex 50 Mcg/act Susp (Mometasone Furoate) .... As Needed 16)  Nitroglycerin 0.4 Mg Subl (Nitroglycerin) .... As Needed Chest Pain 17)  Adult  Aspirin Ec Low Strength 81 Mg Tbec (Aspirin) .... Once Daily 18)  Miralax  Powd (Polyethylene Glycol 3350) .Marland Kitchen.. 17g By Mouth Once Daily Prn 19)  Glucerna  Liqd (Nutritional Supplements) .Marland Kitchen.. 1-3 Cans By Mouth Daily For Nutritional Supplement.  Quantity Unlimited, Refills Prn 20)  Multivitamins   Tabs (Multiple Vitamin) .Marland Kitchen.. 1 Tab By Mouth Once Daily 21)  Ester-C 500-550 Mg Tabs (Bioflavonoid Products) .Marland Kitchen.. 1 Tab By Mouth Once Daily  Allergies: 1)  ! Morphine 2)  ! Codeine 3)  ! * Latex  Past History:  Past Medical History: Last updated: 07/31/2008 Current Problems:  HEART BLOCK (ICD-426.9): Pacer 03/2003 K[employer]en [employer]  Syncope with high-grade heart block without  inducible ventricular tachycardia in a setting of ischemic heart disease,  depressed left ventricular function, and nonsustained ventricular  tachycardia. CORONARY, ARTERIOSCLEROSIS (ICD-414.00) CABG 1996 patent grafts cath 2005 HYPERTENSION, MILD (ICD-401.1) HYPERLIPIDEMIA (ICD-272.4) GERD (ICD-530.81) SICK SINUS SYNDROME (ICD-427.81) UNSPECIFIED PROBLEMS W/LIMBS AND OTHER PROBLEMS (ICD-V49.9) NECK PAIN (ICD-723.1) NEED PROPHYLACTIC VACCINATION&INOCULATION FLU (ICD-V04.81) ULCER, LEG (ICD-707.10) ACCIDENTAL FALL (ICD-E888.9) TREMOR (ICD-781.0) TIA (ICD-435.9) OVARIAN CYST (ICD-620.2) HYPOTHYROIDISM, UNSPECIFIED (ICD-244.9) HYPERCHOLESTEROLEMIA (ICD-272.0) HEMORRHOIDS, NOS (ICD-455.6) DIABETES MELLITUS II, UNCOMPLICATED (ICD-250.00) DEPRESSION, MAJOR, RECURRENT (ICD-296.30) DEMENTIA, MULTI-INFARCT (ICD-290.40) CLAUDICATION, INTERMITTENT (ICD-443.9) BIPOLAR DISORDER (ICD-296.7) ANXIETY (ICD-300.00)   neurological disease Admitted 1/05 for syncope- NSVT, complete heart,  block (HR 35), ?sz activity- got cath (patent, breast cancer, s/p mastectomy, grafts) and pacemaker., h/o hypokalemia, h/o pyschosis, h/o renal artery stenosis s/p repair, Hosp admit 7/01 w/intubation/resp failure, L ovary cystic mass--watchful  waiting, recurrent UTI`s.  new diagnosis of ? parkinsons disease  Vital Signs:  Patient profile:   77 year old female Height:      64 inches Weight:      146 pounds BMI:     25.15 Pulse rate:   60 / minute BP sitting:   120 / 60  (left arm)  Vitals Entered By: Laurance Flatten CMA (December 22, 2009 10:43 AM)  Physical Exam  General:  The patient was alert and oriented in no acute distress. HEENT Normal.  Neck veins were flat, carotids were brisk.  Lungs were clear.  Heart sounds were regular without murmurs or gallops.  Abdomen was soft with active bowel sounds. There is no clubbing cyanosis or edema. Skin Warm and dry    PPM Specifications Following MD:  Noralyn Pick. Eden Emms, MD     PPM Vendor:  [employer]     PPM Model Number:  [id number]     PPM Serial Number:  [id number] PPM DOI:  04/14/2003     PPM Implanting MD:  Sherryl Manges, MD  Lead 1    Location: RA     DOI: 04/14/2003     Model #: [id number]     Serial #: [id number]     Status: active Lead 2    Location: RV     DOI: 04/14/2003     Model #: [id number]     Serial #: [id number]     Status: active   Indications:  Syncope   PPM Follow Up Battery Voltage:  2.72 V     Battery Est. Longevity:  23 mths      Pacer Dependent:  Yes       PPM Device Measurements Atrium  Amplitude: 2.00 mV, Impedance: 524 ohms, Threshold: 0.50 V at 0.40 msec Right Ventricle  Amplitude: PACED mV, Impedance: 540 ohms, Threshold: 0.50 V at 0.40 msec  Episodes MS Episodes:  0     Ventricular High Rate:  0     Atrial Pacing:  94.4%     Ventricular Pacing:  99.9%  Parameters Mode:  DDDR     Lower Rate Limit:  60     Upper Rate Limit:  130 Paced AV Delay:  150     Sensed AV Delay:  120 Next Remote Date:  03/25/2010     Next Cardiology Appt Due:  12/13/2010 Tech Comments:  NORMAL DEVICE FUNCTION.  NO EPISODES SINCE LAST CHECK.  NO CHANGES MADE. CARELINK 03-25-10 AND ROV IN 12 MTHS W/SK.   Impression & Recommendations:  Problem # 1:  CHF (ICD-428.0) we will check her  BNP today; she is to see Dr. Eden Emms in 2 weeks' time. At that visit we will also repeat her echo which as previously notable for depressed left ventricular function. Will not adjust her diuretics at this time as on examination she is euvolemic  Problem # 2:  AV BLOCK, COMPLETE (ICD-426.0) she is device dependent. Estimated longevity of her device is 23 months.  Orders: TLB-BNP (B-Natriuretic Peptide) ([id number])  Problem # 3:  CAD, ARTERY BYPASS GRAFT (ICD-414.04)  wevidence of ischemia. Her propranolol dose was raised minimal. I will defer to Dr. Dorien Chihuahua what he would like to do about that. Her updated medication list for this problem includes:    Enalapril Maleate  5 Mg Tabs (Enalapril maleate) .Marland Kitchen... 1 tab by mouth two times a day    Propranolol Hcl 10 Mg Tabs (Propranolol hcl) .Marland Kitchen... 1 tab by mouth three times a day.    Nitroglycerin 0.4 Mg Subl (Nitroglycerin) .Marland Kitchen... As needed chest pain    Adult Aspirin Ec Low Strength 81 Mg Tbec (Aspirin) ..... Once daily  Her updated medication list for this problem includes:    Enalapril Maleate 5 Mg Tabs (Enalapril maleate) .Marland Kitchen... 1 tab by mouth two times a day    Propranolol Hcl 10 Mg Tabs (Propranolol hcl) .Marland Kitchen... 1 tab by mouth three times a day.    Nitroglycerin 0.4 Mg Subl (Nitroglycerin) .Marland Kitchen... As needed chest pain    Adult Aspirin Ec Low Strength 81 Mg Tbec (Aspirin) ..... Once daily  Problem # 4:  PACEMAKER, PERMANENT  MDT DDD (ICD-V45.01) Device parameters and data were reviewed and no changes were made  Other Orders: Echocardiogram (Echo)  Patient Instructions: 1)  Your physician recommends that you continue on your current medications as directed. Please refer to the Current Medication list given to you today. 2)  Your physician has requested that you have an echocardiogram on 12/29/09 before see Dr. Eden Emms.  Echocardiography is a painless test that uses sound waves to create images of your heart. It provides your doctor with information  about the size and shape of your heart and how well your heart's chambers and valves are working.  This procedure takes approximately one hour. There are no restrictions for this procedure. 3)  Your physician recommends that you have a BNP toady. 4)  Your physician wants you to follow-up in: 1 year  You will receive a reminder letter in the mail two months in advance. If you don't receive a letter, please call our office to schedule the follow-up appointment.

## 2010-04-13 NOTE — Letter (Signed)
Summary: Device-Delinquent Phone Journalist, newspaper, Main Office  [address]   [place], [place] [address]   Phone: [phone]  Fax: [phone]     May 22, 2009 MRN: [id number]   [facility] [address] [place], [place]  [address]   Dear Ms. Sudol,  According to our records, you were scheduled for a device phone transmission on        05/20/2009.                      .     We did not receive any results from this check.  If you transmitted on your scheduled day, please call us to help troubleshoot your system.  If you forgot to send your transmission, please send one upon receipt of this letter.  Thank you,  Altha Harm, LPN  May 22, 2009 8:47 AM  [facility]

## 2010-04-13 NOTE — Progress Notes (Signed)
Summary: pls call   Phone Note From Other Clinic Call back at [phone]   Caller: [facility]  Summary of Call: did an assessment of her and there are discrepancies with meds Initial call taken by: De Nurse,  June 09, 2009 9:51 AM  Follow-up for Phone Call        message left to return call. Follow-up by: Theresia Lo RN,  June 09, 2009 11:55 AM  Additional Follow-up for Phone Call Additional follow up Details #1::        spoke with Darl Pikes. she states a visit was made recently and there were 3 med discrepencies that she needs to let MD know about.  1)  she is not taking Miralax, will occasionaly take a stool softner but has not needed one in past 30 days. 2)   takes Vitamin 800 International Units daily 3)   uses extra  strength  tylenol as needed  Additional Follow-up by: Theresia Lo RN,  June 09, 2009 5:03 PM

## 2010-04-13 NOTE — Assessment & Plan Note (Signed)
Summary: med refills/[facility]/blue team   Vital Signs:  Patient profile:   77 year old female Height:      64 inches Weight:      152 pounds BMI:     26.19 Temp:     98.0 degrees F oral Pulse rate:   80 / minute BP sitting:   102 / 65  (left arm) Cuff size:   regular  Vitals Entered By: Tessie Fass CMA (November 18, 2009 10:58 AM) CC: Rx refill   Primary Care Provider:  Antoine Primas DO  CC:  Rx refill.  History of Present Illness: 77 yo female here for follow up on multiple problems 1.  HTN-  Pt has been very well controlled, checked weekly by home nurse, doing well no headaches, nausea vomiting lightheadness.  No swelling if anything feels dry ( takes Lasix)  2.  anemia-  Pt has had it for a long time, no problems no lightheadness, no change in bowel or bladder no dark stools, no bleeding.   3.  Hypothyroidism-  Pt is on a ver low dose, no side effects,  no hair loss but feels she has been gaining weight no swelling  4. Hyperlipdiemia-  Been a year since checked. Been taking meds regularly.   5.  Ovarian cyst-  Pt has hx of this 4 years ago, was a possibility of ovarian cancer but did not want any intervention so work up was not followed up.  Pt has not had a lot of change in weight no worsenign in health.  Pt still declines any work up, not having any pain, bowel or bladder problems.    6.  Bipolar dx-  Follwed by specialist.  Doing well good mood Preventitive, declines pap, colonoscopy  not due til 2014.  Accompanied by daughter who is primary care giver.   7.  heart- followed by cardiology s/p CABG x 5 and pacer.   Current Medications (verified): 1)  Enalapril Maleate 5 Mg  Tabs (Enalapril Maleate) .Marland Kitchen.. 1 Tab By Mouth Two Times A Day 2)  Synthroid 25 Mcg  Tabs (Levothyroxine Sodium) .Marland Kitchen.. 1 Tab By Mouth Daily. 3)  Aricept 10 Mg  Tabs (Donepezil Hcl) .Marland Kitchen.. 1 Tab By Mouth Qhs 4)  Abilify 15 Mg Tabs (Aripiprazole) .... Take 1 Tablet By Mouth Once A Day 5)  Furosemide 40 Mg  Tabs (Furosemide) .... 1/2  Tab By Mouth Daily 6)  Klor-Con M20 20 Meq Tbcr (Potassium Chloride Crys Cr) .... Take 1 Tablet By Mouth Once A Day 7)  Pentoxifylline Cr 400 Mg Tbcr (Pentoxifylline) .... One Tab By Mouth Tid 8)  Propranolol Hcl 10 Mg Tabs (Propranolol Hcl) .Marland Kitchen.. 1 Tab By Mouth Three Times A Day. 9)  Simvastatin 40 Mg Tabs (Simvastatin) .... Take 1 Tablet Once A Day. 10)  Wellbutrin Xl 150 Mg Tb24 (Bupropion Hcl) .... Take 3 Tablet By Mouth Every Morning 11)  Trihexyphenidyl Hcl 2 Mg  Tabs (Trihexyphenidyl Hcl) .Marland Kitchen.. 1 Tab By Mouth Qd 12)  Prilosec Otc 20 Mg  Tbec (Omeprazole Magnesium) .Marland Kitchen.. 1 Tab By Mouth Twice Daily 13)  Lorazepam 0.5 Mg  Tabs (Lorazepam) .Marland Kitchen.. 1 By Mouth Two Times A Day 14)  Sertraline Hcl 100 Mg  Tabs (Sertraline Hcl) .Marland Kitchen.. 1 By Mouth Qam 15)  Nasonex 50 Mcg/act Susp (Mometasone Furoate) .... As Needed 16)  Nitroglycerin 0.4 Mg Subl (Nitroglycerin) .... As Needed Chest Pain 17)  Adult Aspirin Ec Low Strength 81 Mg Tbec (Aspirin) .... Once Daily 18)  Miralax  Powd (Polyethylene Glycol 3350) .Marland Kitchen.. 17g By Mouth Once Daily Prn 19)  Glucerna  Liqd (Nutritional Supplements) .Marland Kitchen.. 1-3 Cans By Mouth Daily For Nutritional Supplement.  Quantity Unlimited, Refills Prn 20)  Multivitamins   Tabs (Multiple Vitamin) .Marland Kitchen.. 1 Tab By Mouth Once Daily 21)  Ester-C 500-550 Mg Tabs (Bioflavonoid Products) .Marland Kitchen.. 1 Tab By Mouth Once Daily  Allergies (verified): 1)  ! Morphine 2)  ! Codeine 3)  ! * Latex PMH-FH-SH reviewed-no changes except otherwise noted  Review of Systems       see hpi  Physical Exam  General:  Frail, in a wheelchair, not acutely ill Mouth:  MMM Lungs:  Course expiratory sounds, no obvious wheeze or rales. Heart:  Normal rate and regular rhythm. S1 and S2 normal without gallop, murmur, click, rub or other extra sounds. Abdomen:  NT no masses palpated, BS + Pulses:  1+ Extremities:  no edema   Impression & Recommendations:  Problem # 1:  ANEMIA, MACROCYTIC  (ICD-281.9) Has not been checked for about a year, will check to see ifstable or need for iron. Could be chronic as well.  Orders: CBC-[facility] (19147) [facility]- Est  Level 4 (82956)  Problem # 2:  HYPERTENSION, MILD (ICD-401.1) Well controlled, actually low, will decrease lasix to 20mg , daughter able to titrate up if get swelling, will get BMEt today, will monitor.  Her updated medication list for this problem includes:    Enalapril Maleate 5 Mg Tabs (Enalapril maleate) .Marland Kitchen... 1 tab by mouth two times a day    Furosemide 40 Mg Tabs (Furosemide) .Marland Kitchen... 1/2  tab by mouth daily    Propranolol Hcl 10 Mg Tabs (Propranolol hcl) .Marland Kitchen... 1 tab by mouth three times a day.  Orders: Comp Met-[facility] ([id number]) [facility]- Est  Level 4 (69629)  Problem # 3:  HYPERLIPIDEMIA (ICD-272.4) Will get direct LDL today Her updated medication list for this problem includes:    Simvastatin 40 Mg Tabs (Simvastatin) .Marland Kitchen... Take 1 tablet once a day.  Orders: Direct LDL-[facility] (52841-32440)  Problem # 4:  HYPOTHYROIDISM, UNSPECIFIED (ICD-244.9) will get TSH and monitor.  Her updated medication list for this problem includes:    Synthroid 25 Mcg Tabs (Levothyroxine sodium) .Marland Kitchen... 1 tab by mouth daily.  Orders: TSH-[facility] ([id number]) [facility]- Est  Level 4 (40347)  Problem # 5:  OVARIAN CYST (ICD-620.2) No mass palpated at this time, pt and family given the option to have an ultrasound to see if size has changed over the course of daughter and pt though states that it would not change what they are doing for it so they declined any further workup.   Orders: [facility]- Est  Level 4 (42595)  Complete Medication List: 1)  Enalapril Maleate 5 Mg Tabs (Enalapril maleate) .Marland Kitchen.. 1 tab by mouth two times a day 2)  Synthroid 25 Mcg Tabs (Levothyroxine sodium) .Marland Kitchen.. 1 tab by mouth daily. 3)  Aricept 10 Mg Tabs (Donepezil hcl) .Marland Kitchen.. 1 tab by mouth qhs 4)  Abilify 15 Mg Tabs (Aripiprazole) .... Take 1 tablet by mouth once a day 5)  Furosemide 40 Mg Tabs  (Furosemide) .... 1/2  tab by mouth daily 6)  Klor-con M20 20 Meq Tbcr (Potassium chloride crys cr) .... Take 1 tablet by mouth once a day 7)  Pentoxifylline Cr 400 Mg Tbcr (Pentoxifylline) .... One tab by mouth tid 8)  Propranolol Hcl 10 Mg Tabs (Propranolol hcl) .Marland Kitchen.. 1 tab by mouth three times a day. 9)  Simvastatin 40 Mg Tabs (  Simvastatin) .... Take 1 tablet once a day. 10)  Wellbutrin Xl 150 Mg Tb24 (Bupropion hcl) .... Take 3 tablet by mouth every morning 11)  Trihexyphenidyl Hcl 2 Mg Tabs (Trihexyphenidyl hcl) .Marland Kitchen.. 1 tab by mouth qd 12)  Prilosec Otc 20 Mg Tbec (Omeprazole magnesium) .Marland Kitchen.. 1 tab by mouth twice daily 13)  Lorazepam 0.5 Mg Tabs (Lorazepam) .Marland Kitchen.. 1 by mouth two times a day 14)  Sertraline Hcl 100 Mg Tabs (Sertraline hcl) .Marland Kitchen.. 1 by mouth qam 15)  Nasonex 50 Mcg/act Susp (Mometasone furoate) .... As needed 16)  Nitroglycerin 0.4 Mg Subl (Nitroglycerin) .... As needed chest pain 17)  Adult Aspirin Ec Low Strength 81 Mg Tbec (Aspirin) .... Once daily 18)  Miralax Powd (Polyethylene glycol 3350) .Marland Kitchen.. 17g by mouth once daily prn 19)  Glucerna Liqd (Nutritional supplements) .Marland Kitchen.. 1-3 cans by mouth daily for nutritional supplement.  quantity unlimited, refills prn 20)  Multivitamins Tabs (Multiple vitamin) .Marland Kitchen.. 1 tab by mouth once daily 21)  Ester-c 500-550 Mg Tabs (Bioflavonoid products) .Marland Kitchen.. 1 tab by mouth once daily  Other Orders: Influenza Vaccine MCR (63016) Prescriptions: FUROSEMIDE 40 MG TABS (FUROSEMIDE) 1/2  tab by mouth daily  #45 x 3   Entered and Authorized by:   Antoine Primas DO   Signed by:   Antoine Primas DO on 11/18/2009   Method used:   Electronically to        [facility] [phone]* (retail)       [address]       [place], [place]  [address]       Ph: [phone]       Fax: [phone]   RxID:   [id number] NASONEX 50 MCG/ACT SUSP (MOMETASONE FUROATE) as needed  #1 x 3   Entered and Authorized by:   Antoine Primas DO    Signed by:   Antoine Primas DO on 11/18/2009   Method used:   Electronically to        [facility] [phone]* (retail)       [address]       [place], [place]  [address]       Ph: [phone]       Fax: [phone]   RxID:   [id number] NITROGLYCERIN 0.4 MG SUBL (NITROGLYCERIN) as needed chest pain  #25 x 1   Entered and Authorized by:   Antoine Primas DO   Signed by:   Antoine Primas DO on 11/18/2009   Method used:   Electronically to        [id number] (retail)       [address]       [place], [place]  [address]       Ph: [phone]       Fax: [phone]   RxID:   [id number] SIMVASTATIN 40 MG TABS (SIMVASTATIN) Take 1 tablet once a day.  #31 Tablet x 3   Entered and Authorized by:   Antoine Primas DO   Signed by:   Antoine Primas DO on 11/18/2009   Method used:   Electronically to        [facility] [phone]* (retail)       [address] [facility],  [place]  [address]       Ph: [phone]       Fax: [phone]   RxID:   [id number] PROPRANOLOL HCL 10 MG TABS (PROPRANOLOL HCL) 1 tab by mouth three times a day.  #90 x 3   Entered and Authorized by:   Antoine Primas DO   Signed by:   Antoine Primas DO on 11/18/2009   Method used:   Electronically to        [facility] [phone]* (retail)       [address]       [place], [place]  [address]       Ph: [phone]       Fax: [phone]   RxID:   [id number] PENTOXIFYLLINE CR 400 MG TBCR (PENTOXIFYLLINE) one tab by mouth tid  #90 x 3   Entered and Authorized by:   Antoine Primas DO   Signed by:   Antoine Primas DO on 11/18/2009   Method used:   Electronically to        [facility] [phone]* (retail)       [address]       [place], [place]  [address]       Ph: [phone]       Fax: [phone]   RxID:   [id number] KLOR-CON M20 20 MEQ TBCR (POTASSIUM  CHLORIDE CRYS CR) Take 1 tablet by mouth once a day  #45 Tablet x 3   Entered and Authorized by:   Antoine Primas DO   Signed by:   Antoine Primas DO on 11/18/2009   Method used:   Electronically to        [facility] [phone]* (retail)       [address]       [place], [place]  [address]       Ph: [phone]       Fax: [phone]   RxID:   [id number] ARICEPT 10 MG  TABS (DONEPEZIL HCL) 1 TAB by mouth QHS  #32 Tablet x 6   Entered and Authorized by:   Antoine Primas DO   Signed by:   Antoine Primas DO on 11/18/2009   Method used:   Electronically to        [facility] [phone]* (retail)       [address]       [place], [place]  [address]       Ph: [phone]       Fax: [phone]   RxID:   [id number] SYNTHROID 25 MCG  TABS (LEVOTHYROXINE SODIUM) 1 tab by mouth daily.  #31 x 5   Entered and Authorized by:   Antoine Primas DO   Signed by:   Antoine Primas DO on 11/18/2009   Method used:   Electronically to        [facility] [phone]* (retail)       [address]       [place], [place]  [address]       Ph: [phone]       Fax: [phone]   RxID:   [id number] ENALAPRIL MALEATE 5 MG  TABS (ENALAPRIL MALEATE) 1 tab by mouth two times a  day  #64 Tablet x 6   Entered and Authorized by:   Antoine Primas DO   Signed by:   Antoine Primas DO on 11/18/2009   Method used:   Electronically to        [facility] [phone]* (retail)       [address]       [place], [place]  [address]       Ph: [phone]       Fax: [phone]   RxID:   [id number]   Prevention & Chronic Care Immunizations   Influenza vaccine: Fluvax MCR  (11/18/2009)   Influenza vaccine due: 11/26/2009    Tetanus booster: 10/13/1995: Done.   Tetanus booster due: Not Indicated    Pneumococcal vaccine: Done.  (01/12/2001)   Pneumococcal vaccine due: Not Indicated    H. zoster  vaccine: Not documented  Colorectal Screening   Hemoccult: Not documented   Hemoccult due: Not Indicated    Colonoscopy: Done.  (06/13/2002)   Colonoscopy due: 06/12/2012  Other Screening   Pap smear: Not documented   Pap smear due: Not Indicated    Mammogram: Not documented   Mammogram due: Not Indicated    DXA bone density scan: Not documented   DXA scan due: Not Indicated    Smoking status: never  (12/19/2008)  Lipids   Total Cholesterol: Not documented   LDL: Not documented   LDL Direct: 90  (12/19/2008)   HDL: Not documented   Triglycerides: Not documented    SGOT (AST): 26  (12/19/2008)   SGPT (ALT): 23  (12/19/2008) CMP ordered    Alkaline phosphatase: 78  (12/19/2008)   Total bilirubin: 0.3  (12/19/2008)  Hypertension   Last Blood Pressure: 102 / 65  (11/18/2009)   Serum creatinine: 1.28  (12/19/2008)   Serum potassium 4.7  (12/19/2008) CMP ordered   Self-Management Support :   Personal Goals (by the next clinic visit) :      Personal blood pressure goal: 130/80  (12/19/2008)     Personal LDL goal: 100  (12/19/2008)    Hypertension self-management support: Not documented    Hypertension self-management support not done because: Good outcomes  (12/19/2008)    Lipid self-management support: Not documented     Lipid self-management support not done because: Good outcomes  (12/19/2008)   Immunizations Administered:  Influenza Vaccine # 1:    Vaccine Type: Fluvax MCR    Site: right deltoid    Mfr: GlaxoSmithKline    Dose: 0.5 ml    Route: IM    Given by: Tessie Fass CMA    Exp. Date: 09/08/2010    Lot #: [id number]    VIS given: 10/05/06 version given November 18, 2009.  Flu Vaccine Consent Questions:    Do you have a history of severe allergic reactions to this vaccine? no    Any prior history of allergic reactions to egg and/or gelatin? no    Do you have a sensitivity to the preservative Thimersol? no    Do you have a past history of  Guillan-Barre Syndrome? no    Do you currently have an acute febrile illness? no    Have you ever had a severe reaction to latex? no    Vaccine information given and explained to patient? yes    Are you currently pregnant? no

## 2010-04-13 NOTE — Miscellaneous (Signed)
Summary: refill   Clinical Lists Changes rec'd request for propranalol. this was refilled in Dec & has 3 refills.lm for pt to call me back.Golden Circle RN  April 03, 2009 11:55 AM  spoke with pt's cna. told her to call her pahrmacy for the refill as she has refills left. states she will do this for her.Golden Circle RN  April 06, 2009 10:51 AM

## 2010-04-13 NOTE — Cardiovascular Report (Signed)
Summary: Office Visit   Office Visit   Imported By: Roderic Ovens 12/24/2009 12:08:43  _____________________________________________________________________  External Attachment:    Type:   Image     Comment:   External Document

## 2010-04-15 NOTE — Letter (Signed)
Summary: Device-Delinquent Phone Journalist, newspaper, Main Office  [address]   [place], [place] [address]   Phone: [phone]  Fax: [phone]     March 26, 2010 MRN: [id number]   [facility] [place] [address] [place], [place]  [address]   Dear Ms. Orne,  According to our records, you were scheduled for a device phone transmission on  03-25-2010.     We did not receive any results from this check.  If you transmitted on your scheduled day, please call us to help troubleshoot your system.  If you forgot to send your transmission, please send one upon receipt of this letter.  Thank you,   Architectural technologist [facility]

## 2010-05-03 ENCOUNTER — Encounter (INDEPENDENT_AMBULATORY_CARE_PROVIDER_SITE_OTHER): Payer: Self-pay | Admitting: *Deleted

## 2010-05-11 NOTE — Letter (Signed)
Summary: Remote Device Check  [employer], Main Office  [address]   [place], [place] [address]   Phone: [phone]  Fax: [phone]     May 03, 2010 MRN: [id number]   [facility] [place] [address] [place], [place]  [address]   Dear Ms. Kaplan,   Your remote transmission was recieved and reviewed by your physician.  All diagnostics were within normal limits for you.  __X___Your next transmission is scheduled for:   07-15-2010.  Please transmit at any time this day.  If you have a wireless device your transmission will be sent automatically.   Sincerely,  Vella Kohler

## 2010-05-11 NOTE — Cardiovascular Report (Signed)
Summary: Office Visit Remote   Office Visit Remote   Imported By: Roderic Ovens 05/07/2010 11:42:39  _____________________________________________________________________  External Attachment:    Type:   Image     Comment:   External Document

## 2010-05-20 ENCOUNTER — Other Ambulatory Visit: Payer: Self-pay | Admitting: Family Medicine

## 2010-05-20 NOTE — Telephone Encounter (Signed)
Refill request

## 2010-05-24 ENCOUNTER — Other Ambulatory Visit: Payer: Self-pay | Admitting: Family Medicine

## 2010-05-24 NOTE — Telephone Encounter (Signed)
Refill request

## 2010-05-27 ENCOUNTER — Other Ambulatory Visit: Payer: Self-pay | Admitting: Family Medicine

## 2010-05-27 NOTE — Telephone Encounter (Signed)
Refill request

## 2010-06-08 ENCOUNTER — Telehealth: Payer: Self-pay | Admitting: Internal Medicine

## 2010-06-08 NOTE — Telephone Encounter (Signed)
Pt's dtr calling to see when surgery for battery change has been scheduled/also has congestion

## 2010-06-08 NOTE — Telephone Encounter (Signed)
Spoke with daughter needs to f/u with PMD for listed symptoms also had concerns with device informed device is working properly best can tell from last check is due for carelink transmission  Again on May 3 ,2012

## 2010-06-10 ENCOUNTER — Telehealth: Payer: Self-pay | Admitting: Cardiovascular Disease

## 2010-06-10 NOTE — Telephone Encounter (Signed)
Spoke with pt sister, questions answered

## 2010-06-21 ENCOUNTER — Other Ambulatory Visit: Payer: Self-pay | Admitting: *Deleted

## 2010-06-21 MED ORDER — PENTOXIFYLLINE 400 MG PO TBCR: 400.0000 mg | EXTENDED_RELEASE_TABLET | Freq: Three times a day (TID) | ORAL | Status: DC

## 2010-06-22 ENCOUNTER — Ambulatory Visit (INDEPENDENT_AMBULATORY_CARE_PROVIDER_SITE_OTHER): Payer: Medicare Other | Admitting: Family Medicine

## 2010-06-22 ENCOUNTER — Encounter: Payer: Self-pay | Admitting: Family Medicine

## 2010-06-22 DIAGNOSIS — E039 Hypothyroidism, unspecified: Secondary | ICD-10-CM

## 2010-06-22 DIAGNOSIS — E785 Hyperlipidemia, unspecified: Secondary | ICD-10-CM

## 2010-06-22 DIAGNOSIS — I10 Essential (primary) hypertension: Secondary | ICD-10-CM

## 2010-06-22 DIAGNOSIS — D539 Nutritional anemia, unspecified: Secondary | ICD-10-CM

## 2010-06-22 NOTE — Patient Instructions (Signed)
It was a pleasure meeting you I will refill all your medications We will start metformin 1 pill in morning I will get some labs today and no news is good news.  I want to see you again 6 months.

## 2010-06-23 ENCOUNTER — Encounter: Payer: Self-pay | Admitting: Family Medicine

## 2010-06-23 LAB — CBC WITH DIFFERENTIAL/PLATELET
Eosinophils Absolute: 0.5 10*3/uL (ref 0.0–0.7)
Eosinophils Relative: 6 % — ABNORMAL HIGH (ref 0–5)
Hemoglobin: 13.7 g/dL (ref 12.0–15.0)
Lymphocytes Relative: 34 % (ref 12–46)
Lymphs Abs: 2.6 10*3/uL (ref 0.7–4.0)
MCH: 33.3 pg (ref 26.0–34.0)
MCV: 101.2 fL — ABNORMAL HIGH (ref 78.0–100.0)
Monocytes Relative: 10 % (ref 3–12)
Neutrophils Relative %: 49 % (ref 43–77)
Platelets: 198 10*3/uL (ref 150–400)
RBC: 4.12 MIL/uL (ref 3.87–5.11)
WBC: 7.8 10*3/uL (ref 4.0–10.5)

## 2010-06-23 LAB — COMPREHENSIVE METABOLIC PANEL
ALT: 32 U/L (ref 0–35)
AST: 47 U/L — ABNORMAL HIGH (ref 0–37)
BUN: 18 mg/dL (ref 6–23)
Calcium: 9.9 mg/dL (ref 8.4–10.5)
Creat: 0.95 mg/dL (ref 0.40–1.20)
Total Bilirubin: 0.3 mg/dL (ref 0.3–1.2)

## 2010-06-23 NOTE — Assessment & Plan Note (Signed)
Feel at goal no need to get too tight of control, pt is doing well. Seen by cardiologist as well.

## 2010-06-23 NOTE — Assessment & Plan Note (Signed)
Will get direct LDL today.   

## 2010-06-23 NOTE — Progress Notes (Signed)
  Subjective:    Patient ID: [name], female    DOB: 1923-10-08, 77 y.o.   MRN: [id number]  HPI 1. Hypertension Blood pressure at home: checked by home health nurse does well usually around 130 SBP Blood pressure today: 131/62 Taking Meds:yes Side effects:no ROS: Denies headache visual changes nausea, vomiting, chest pain or abdominal pain or shortness of breath.  Hypothyroidism-  Pt is doing well, denies hair loss, swelling, fatigue out of the ordinary, visual changes, or pain out of the ordinary, maybe a little trouble swallowing from time to time. No hx of aspiration pneumonia.   Anemia-  Have not had cbc checked for some time, denies change in stool color, fatigue, palpitations.   Bipolar/depression-  Pt is followed by psychologist and is doing well per family member  Code status-  Discussed with family states she is a DNI, would consider DNR at this time.   HLD-  Time to check  Pt is wheel chair bound but able to roll self around without much problem, living at home with daughter.    Review of Systems Denies fever, chills, nausea vomiting abdominal pain, dysuria, chest pain, shortness of breath dyspnea on exertion or numbness in extremities     Objective:   Physical Exam GEn: Sitting in wheel chair, pleasant a lot of make up on HEENT:  MMM, uvula midline CV:  RRR 2/6 SEM heard LLSB Pul: CTAB Abd: soft, NT, ND Ext: NVI, no edema,       Assessment & Plan:

## 2010-06-23 NOTE — Assessment & Plan Note (Signed)
Check TSh today no sign of over or under medicated.

## 2010-06-23 NOTE — Assessment & Plan Note (Signed)
Will get CBC today and make sure pt is not too anemic, may need iron supplement, physical exam did not show any drastic physical signs.

## 2010-06-28 ENCOUNTER — Other Ambulatory Visit: Payer: Self-pay | Admitting: Family Medicine

## 2010-06-28 NOTE — Telephone Encounter (Signed)
Refill request

## 2010-07-05 ENCOUNTER — Ambulatory Visit (INDEPENDENT_AMBULATORY_CARE_PROVIDER_SITE_OTHER): Payer: Medicare Other | Admitting: Cardiovascular Disease

## 2010-07-05 ENCOUNTER — Encounter: Payer: Self-pay | Admitting: Cardiovascular Disease

## 2010-07-05 DIAGNOSIS — I25810 Atherosclerosis of coronary artery bypass graft(s) without angina pectoris: Secondary | ICD-10-CM

## 2010-07-05 DIAGNOSIS — I2510 Atherosclerotic heart disease of native coronary artery without angina pectoris: Secondary | ICD-10-CM

## 2010-07-05 DIAGNOSIS — I442 Atrioventricular block, complete: Secondary | ICD-10-CM

## 2010-07-05 DIAGNOSIS — I10 Essential (primary) hypertension: Secondary | ICD-10-CM

## 2010-07-05 DIAGNOSIS — E785 Hyperlipidemia, unspecified: Secondary | ICD-10-CM

## 2010-07-05 NOTE — Assessment & Plan Note (Signed)
No chest pain.  Limited activity.  Candidate only for medical Rx

## 2010-07-05 NOTE — Assessment & Plan Note (Signed)
Well controlled.  Continue current medications and low sodium Dash type diet.    

## 2010-07-05 NOTE — Progress Notes (Signed)
[name] is seen today for F/U CAD with distant history of CABG.  She is wheel chair bound from neurological problems.  She has not had any SSCP.  Her bypass was in 1994.  She has a pacer in for SSS and heart block.  Initila implant was in 2005 with syncope.  She looks good today with less tremulousness.  She has had no dyspnea, pnd,orthopnea or edema.  Her pacer has been working well and she is not pacer dependant  Dr. Isaias Cowman her primary would like to simplify her meds and I think this would be fine.  She has a full time aid "Porshe" who was with her today.  Echo today reviewed and EF 40-45% with mild MR and severe LVH.    ROS: Denies fever, malais, weight loss, blurry vision, decreased visual acuity, cough, sputum, SOB, hemoptysis, pleuritic pain, palpitaitons, heartburn, abdominal pain, melena, lower extremity edema, claudication, or rash.   General: Affect appropriate Healthy:  appears stated age HEENT: normal Neck supple with no adenopathy JVP normal no bruits no thyromegaly Lungs clear with no wheezing and good diaphragmatic motion Heart:  S1/S2 no murmur,rub, gallop or click PMI normal Abdomen: benighn, BS positve, no tenderness, no AAA no bruit.  No HSM or HJR Distal pulses intact with no bruits No edema Neuro non-focal Skin warm and dry Stiff from neck up with decrease muscle strength and weakness all 4 extremities    Current Outpatient Prescriptions  Medication Sig Dispense Refill  . ARIPiprazole (ABILIFY) 15 MG tablet Take 15 mg by mouth daily.        Marland Kitchen aspirin 81 MG EC tablet Take 81 mg by mouth daily.        Marland Kitchen Bioflavonoid Products (ESTER-C) 500-550 MG TABS Take 1 tablet by mouth daily.        Marland Kitchen buPROPion (WELLBUTRIN XL) 150 MG 24 hr tablet Take 3 tablets by mouth every morning       . donepezil (ARICEPT) 10 MG tablet Take 10 mg by mouth at bedtime.        . enalapril (VASOTEC) 5 MG tablet Take 5 mg by mouth 2 (two) times daily.        . furosemide (LASIX) 40 MG tablet Take  40 mg by mouth daily.        Marland Kitchen Glucerna (GLUCERNA) LIQD 1 to 3 cans by mouth daily for nutritional supplement       . KLOR-CON M20 20 MEQ tablet TAKE 1 TABLET DAILY  45 tablet  3  . levothyroxine (SYNTHROID, LEVOTHROID) 25 MCG tablet Take 25 mcg by mouth daily.        Marland Kitchen LORazepam (ATIVAN) 0.5 MG tablet Take 0.5 mg by mouth 2 (two) times daily.        . metFORMIN (GLUCOPHAGE) 500 MG tablet TAKE 1 TABLET EVERY DAY  31 tablet  4  . mometasone (NASONEX) 50 MCG/ACT nasal spray 2 sprays by Nasal route as needed.        . Multiple Vitamin (MULTIVITAMIN) tablet Take 1 tablet by mouth daily.        . nitroGLYCERIN (NITROSTAT) 0.4 MG SL tablet Place 0.4 mg under the tongue every 5 (five) minutes as needed. For chest pain       . omeprazole (PRILOSEC) 20 MG capsule Take 20 mg by mouth 2 (two) times daily.        . pentoxifylline (TRENTAL) 400 MG CR tablet Take 1 tablet (400 mg total) by mouth 3 (three) times daily with  meals.  90 tablet  3  . polyethylene glycol (MIRALAX) powder Take 17 g by mouth daily as needed.        . propranolol (INDERAL) 10 MG tablet TAKE 1 TABLET BY MOUTH 3 TIMES A DAY  90 tablet  3  . sertraline (ZOLOFT) 100 MG tablet Take 100 mg by mouth every morning.        . simvastatin (ZOCOR) 40 MG tablet TAKE 1 TABLET ONCE A DAY.  31 tablet  3  . trihexyphenidyl (ARTANE) 2 MG tablet Take 2 mg by mouth daily.          Allergies  Codeine; Latex; and Morphine  Electrocardiogram:  Assessment and Plan

## 2010-07-05 NOTE — Assessment & Plan Note (Signed)
Cholesterol is at goal.  Continue current dose of statin and diet Rx.  No myalgias or side effects.  F/U  LFT's in 6 months. No results found for this basename: LDLCALC             

## 2010-07-05 NOTE — Assessment & Plan Note (Signed)
EOL pacer in ? 22 months.  F/U Dr Graciela Husbands one year to check and continue q6 month telephone checks

## 2010-07-05 NOTE — Patient Instructions (Signed)
Your physician recommends that you schedule a follow-up appointment in: 2 years with Dr. Eden Emms  Your physician recommends that you schedule a follow-up appointment in: 1 year with Dr. Graciela Husbands to discuss replacing your pacemaker battery.

## 2010-07-08 ENCOUNTER — Other Ambulatory Visit: Payer: Self-pay | Admitting: Family Medicine

## 2010-07-08 NOTE — Telephone Encounter (Signed)
Refill request

## 2010-07-08 NOTE — Telephone Encounter (Signed)
Refill request for Dr. Michaelle Copas patient.

## 2010-07-15 ENCOUNTER — Encounter: Payer: Self-pay | Admitting: *Deleted

## 2010-07-18 ENCOUNTER — Encounter: Payer: Self-pay | Admitting: *Deleted

## 2010-07-26 ENCOUNTER — Other Ambulatory Visit: Payer: Self-pay | Admitting: Family Medicine

## 2010-07-26 NOTE — Telephone Encounter (Signed)
Refill request

## 2010-07-27 NOTE — Assessment & Plan Note (Signed)
[facility]                            CARDIOLOGY OFFICE NOTE   NAME:[name], [name]                       MRN:          [id number]  DATE:12/08/2006                            DOB:          07-Nov-1923    [name] returns today for followup.  She is status post distant CABG with  sick sinus syndrome and pacemaker therapy.   Unfortunately, her major health problem is her Parkinson's disease.  She  has also had some other neuropsychiatric problems.  She is wheelchair-  bound.  She does not walk.  In talking to her daughter, she says that,  if she tries to walk, she falls all the time.   Despite this, [name] seems to be in good spirits.  She actually seems a  lot more coherent and less stiff than I am used to seeing her.   From a cardiac standpoint, she has not had any problems and there has  been no PND or orthopnea, no palpitations.  She has mild lower extremity  edema from being in the wheelchair.  Her pacemaker seems to be  functioning well.  She was last checked in the clinic about six months  ago.   I believe her pacemaker was placed in 2005.   She has not had any problems in regards to presyncope or postural  symptoms.   Her other health problems include diabetes.  I do not have records on  this.  I do not have a recent hemoglobin A1c, but she has not had any  hypoglycemic events.  Her review of systems is otherwise negative.   She has a two-page med list, which is outlined in the chart.  This was  reviewed from a cardiac perspective.   1. She is on an aspirin a day.  2. Propranolol 10 t.i.d.  3. Lasix 40 a day.  4. Potassium 20 a day.  5. She has p.r.n. nitroglycerin and simvastatin 40 a day.   Please refer to outlined med list for the noncardiac meds.   EXAM:  Remarkable for head-bobbing tremor and some upper extremity  tremors from her Parkinson's disease.  Facies is partially masked.  Blood pressure 120/60, pulse is 60 with pacing,  weight is 159,  respiratory rate is 14, she is afebrile.  HEENT:  Normal.  Carotids are normal without bruit.  There is no lymphadenopathy, no  thyromegaly, no JVP elevation.  LUNGS:  Clear, good diaphragmatic motion, no wheezing.  Pacer is under the collar bone in good position.  There is an S1, S2,  with normal heart sounds.  PMI is normal.  ABDOMEN:  Benign.  Bowel sounds positive.  No tenderness, no  hepatosplenomegaly or hepatojugular reflux.  Distal pulses are intact, no edema.  She is status post saphenous vein  harvest site in the right leg.  There is trace lower extremity edema.  NEUROMUSCULAR EXAM:  Remarkable for Parkinson's disease.  She has  cogwheel rigidity in both wrists and decreased range of motion in the  upper extremities.   IMPRESSION:  1. Stable, status post distant CABG.  No indication for  followup      stress testing, given the fact that she is wheelchair-bound and      asymptomatic.  Continue beta blocker and aspirin.  2. Hypercholesterolemia with old bypass grafts.  Continue simvastatin      40 a day, follow up lipid and liver profile in six months.  3. Sick sinus syndrome with pacemaker therapy.  Follow up with the      pacemaker clinic in six months.  I cannot tell, looking through her      records, if she is pacemaker-dependent.  However, her battery life      seems to be doing well.  She is set at DDDR with a back-up rate of      60, which she seems to be using.  I suspect she is pacemaker-      dependent.  Her last threshold in March of 2008 showed a battery      voltage of 2.76 and there were no changes made.  4. Parkinson's disease.  Follow up with primary care doctor.  Continue      current medications.  She is also on Aricept for dementia.   Whatever changes they have made to her medications, they seem to be  working.     [name]. Eden Emms, MD, [facility]  Electronically Signed    PCN/MedQ  DD: 12/08/2006  DT: 12/08/2006  Job #: [id number]

## 2010-07-27 NOTE — Assessment & Plan Note (Signed)
[facility]                            CARDIOLOGY OFFICE NOTE   NAME:[name], [name] BERCH                       MRN:          [id number]  DATE:01/07/2008                            DOB:          01/31/1924    [name] returns today for followup.  She has distant history of CABG and.  Sick sinus syndrome with pacemaker.  She gets her pacemaker checked,  transtelephonically and apparently is working fine.  She is primarily  wheelchair-bound due to some sort of cerebellar progressive neurological  problem.   She does not having any chest pain.  There has been no syncope.  When I  talked to her family member and caretaker regarding her inability to  walk, it is not necessarily due to muscular weakness but total lack of  balance, which sounds cerebella to me.  I do not have the chart in front  of me and I do not know what workup she has had.  The caretaker  indicates a lack of continuity in care at [facility] having 3  different doctors in the last year and year and half after discussion,  we thought it will be worthwhile for her to see a neurologist since her  biggest problem that keeps her wheelchair bound seems to be a  progressive cerebellar problem.   From cardiac standpoint, she is not having chest pain.  There is a  question previous history of PVD, but she really does not get up and  ambulate and it has not been an issue.   REVIEW OF SYSTEMS:  Otherwise negative.   MEDICATIONS:  1. Trental 400 t.i.d.  2. Omeprazole.  3. Metformin 500 a day.  4. Propranolol 10 t.i.d.  5. Sertraline 100 a day.  6. Enalapril 5 b.i.d.  7. Lasix 40 a day.  8. Abilify 15 a day.  9. Synthroid 25 a day.  10.Wellbutrin 200 a day.  11.Potassium.  12.Simvastatin 40 a day.  13.Bupropion 150 q.a.m.  14.Nasonex.  15.Aricept 15 a day.  16.Multivitamins.   PHYSICAL EXAMINATION:  GENERAL:  Remarkable for an elderly female,  sitting in a wheelchair.  VITAL SIGNS:   Her blood pressure 120/64, pulse is paced at a rate of 60-  65, afebrile, and respiratory rate 14.  Weight was not taken.  HEENT:  Unremarkable.  Carotids are normal without bruit.  No  lymphadenopathy, thyromegaly, or JVP elevation.  LUNGS:  Clear with good diaphragmatic motion.  No wheezing.  S1 and S2.  Normal heart sounds.  PMI normal.  ABDOMEN:  Benign.  Bowel sounds positive.  No AAA, no tenderness, no  bruit, no hepatosplenomegaly, and no hepatojugular reflux or tenderness.  EXTREMITIES:  She moves all four extremities.  She does not have a  marked tremor.  Her head does list to the right side.  NEUROLOGIC:  There is no focal neurological deficits.  SKIN:  Warm and dry.   IMPRESSION:  1. Sick sinus syndrome, status post pacer seems to working fine.      Followup Device [facility].  2. History of distant coronary artery bypass graft,  did not having      chest pain.  Continue aspirin and beta-blocker.  3. Question history of peripheral vascular disease.  Pulses are      actually palpable in her legs.  No ulcers.  Continue Trental.  4. Progressive neurological disease.  Followup with neurology see if      there is any other testing or diagnostic measures need to be done.      Overall, [name]'s heart appears stable.     Noralyn Pick. Eden Emms, MD, [facility]  Electronically Signed    PCN/MedQ  DD: 01/07/2008  DT: 01/08/2008  Job #: [id number]

## 2010-07-27 NOTE — Assessment & Plan Note (Signed)
[facility]                            CARDIOLOGY OFFICE NOTE   NAME:[name] [name] MATTO                       MRN:          [id number]  DATE:07/09/2007                            DOB:          12-13-1923    [name] [name] returns today for follow-up.  She has had a distant history of  CABG with sick sinus syndrome and pacemaker placement.   She is wheelchair bound.  She has been asymptomatic from a cardiac  perspective.  She has a Parkinson's like illness.  She is being treated  by her psychologist.  She does not have a neurologist.   She seems to be doing a bit better.  Interestingly, she has been placed  on Trental by her family practice doctor in the past.  I do believe she  has some PVD, which we have not worked up further since she is  wheelchair bound and has no nonhealing ulcers.  She is stiff without it  and has run out of it.  Apparently, they would not refill it, and I told  her I would be happy to call this into Dr. Tiajuana Amass.  She normally takes  400 t.i.d.  Otherwise, she has been doing well.  She denied any  significant chest pain.  There has been no PND, orthopnea and no lower  extremity edema.   REVIEW OF SYSTEMS:  Otherwise negative.   MEDICATIONS:  1. Omeprazole 20 b.i.d.  2. Trental 400 t.i.d.  3. Aspirin a day.  4. Metformin 500 a day.  5. __________ ointment.  6. Propranolol 10 t.i.d.  7. Sertraline 100 a day.  8. Enalapril 6 b.i.d.  9. Lasix 40 a day.  10.Abilify 15 mg a day.  11.Synthroid 25 mcg a day.  12.Wellbutrin 300 mg a day.  13.Potassium 20 a day.  14.Simvastatin 40 a day.  15.Trihexyphenidyl 2 mg nightly.  16.Bupropion 150 mg q.a.m.  17.Nasonex.  18.Aricept 15 a day.  19.Multivitamins.   PHYSICAL EXAMINATION:  GENERAL:  Remarkable for an elderly white female  in no distress.  She has a bit of a head bobbing tremor.  VITAL SIGNS:  Her weight is 153, blood pressure is 122/64.  She appears  to be paced at a rate of 60,  afebrile, respiratory rate 14.  HEENT:  Unremarkable.  NECK:  Carotids normal without bruit.  No lymphadenopathy, thyromegaly  or JVP elevation.  LUNGS:  Clear.  Good diaphragmatic motion.  No wheezing.  CARDIOVASCULAR:  S1-S2 with normal heart sounds.  PMI normal.  Status  post pacemaker therapy under the left clavicle.  ABDOMEN:  Benign.  Bowel sounds positive.  No obvious bruit, no  hepatosplenomegaly, no hepatojugular reflux.  EXTREMITIES:  Distal pulses are intact, no edema.  NEURO:  Nonfocal.  SKIN:  Warm and dry.  No muscular weakness.  PTs are difficult to  palpate bilaterally.   IMPRESSION:  1. Sick sinus syndrome, status post pacemaker.  Continue      transtelephonic communication.  Follow-up with Dr. Ladona Ridgel in 2      months.  2. History of coronary artery disease, distant.  She is currently not      having chest pain and is essentially wheelchair chair bound and      unable to walk.  Therefore, I do not think stress testing is      indicated.  Continue aspirin therapy.  3. Neurological abnormality akin to Parkinson's disease.  Follow up      with psychologist.  Continue current medications.  4. Hypothyroidism.  Continue Synthroid 25 mcg a day.  TSH and T4 in 3      months.  5. Hypertension, currently well controlled.  Continue current      medications, including enalapril which will help with her diabetes      and proteinuria.  6. Peripheral vascular disease.  A prescription for Trental 400 t.i.d.      resumed.  No nonhealing ulcers.  Again, since she does not      ambulate, I do not think it is worth working this up any further.   Overall it was nice see that Mikalyn seems to be doing better.  She will  follow up with family practice center and her psychologist for her  multiple medical issues.  I believe she already has an appointment to  see Dr. Ladona Ridgel for pacemaker follow-up.     Noralyn Pick. Eden Emms, MD, [facility]  Electronically Signed    PCN/MedQ  DD: 07/09/2007  DT:  07/09/2007  Job #: [id number]

## 2010-07-27 NOTE — Assessment & Plan Note (Signed)
[facility]ALTHC[facility]                            CARDIOLOGY OFFICE NOTE   NAME:[name], [name] WARMUTH                       MRN:          [id number]  DATE:05/08/2008                            DOB:          10-22-1923    PRIMARY CARDIOLOGIST:  Noralyn Pick. Eden Emms, MD, [facility]   PATIENT PROFILE:  An 77 year old Caucasian female with prior history of  CAD status post CABG who presents for followup.   PROBLEMS:  1. History of coronary artery disease.      a.     Status post coronary artery bypass graft.  2. History of sick sinus syndrome.      a.     Status status post placement of Medtronic catheter in       September 2001, dual-chamber permanent pacemaker placed, April 14, 2003.  3. Type 2 diabetes mellitus.  4. Hypothyroidism.  5. Gastroesophageal reflux disease.  6. Hypertension.  7. Hyperlipidemia.  8. Chronic debilitation/wheelchair bound.   HISTORY OF PRESENT ILLNESS:  An 77 year old Caucasian female with the  above problem list.  The patient last saw Dr. Eden Emms in October 2009.  She has been doing well overall and just recently started physical  therapy at home.  Her daughters noticed that she has been somewhat weak  following her physical therapy appointments.  The patient has also noted  some arthritic pain in her shoulder and neck that has been relieved with  ibuprofen.  Yesterday, following physical therapy the patient thought  she heard a clicking type sound that was coming from her chest, right  shoulder, and neck.  There was no associated pain or discomfort.  She  said she just heard a loud clicking noise that occurred whether or not  she was moving.  She is not very clear as to how long this lasted or  exactly what sounded like or felt like.  Her daughter said that the  patient reported clicking and discomfort in her chest, but currently the  patient is saying she did not have any discomfort in her chest.  She  says only that she heard a clicking  sound coming from the chest.  The  patient and daughter were concerned that perhaps her pacemaker was  malfunctioning as per this appointment was made today.  Pacemaker has  been interrogated today and shows normal functioning without any  requirements for changes.   ALLERGIES:  CODEINE, MORPHINE, LATEX.   HOME MEDICATIONS:  1. Omeprazole 20 mg b.i.d.  2. Pentoxifylline 400 mg t.i.d.  3. Aspirin 81 mg daily.  4. Fluocinonide 0.05% daily.  5. Propranolol 10 mg t.i.d.  6. Sertraline 100 mg daily.  7. Enalapril 5 mg b.i.d.  8. Furosemide 40 mg daily.  9. Abilify 15 mg daily.  10.Synthroid 25 mcg daily.  11.Wellbutrin XL 150 mg 3 tablets daily.  12.Potassium 20 mEq daily.  13.Simvastatin 40 mg daily.  14.B12 daily.  15.Ester-C daily.  16.Nasonex 50 mcg once per each nostril daily.  17.Multivitamin daily.  18.Aricept 10 mg nightly.  19.Trihexyphenidyl hydrochloride 2 mg daily.  20.Lorazepam  0.5 mg t.i.d.  21.Perphenazine 2 mg 2 tablets in p.m., one tab in the a.m.  22.Carbidopa-levodopa 25/100 mg t.i.d.   PHYSICAL EXAMINATION:  VITAL SIGNS:  Blood pressure 117/61, heart rate  60, respirations 16, weight is 148 pounds.  GENERAL:  Pleasant white female in no acute distress.  Awake, alert, and  oriented x3.  HEENT:  Normal.  NEURO:  Grossly intact, nonfocal.  PSYCH:  Normal affect.  SKIN:  Warm and dry without lesions or masses.  MUSCULOSKELETAL:  Grossly normal.  No deformity or effusions.  NECK:  No bruits or JVD.  LUNGS:  Respirations are regular and unlabored.  CARDIAC:  Regular S1 and S2.  No S3, no S4, murmurs.  ABDOMEN:  Round, soft, nontender.  Bowel sounds present.  EXTREMITIES:  Warm, dry, pink.  No clubbing, cyanosis or edema.  Dorsalis pedis, posterior tibial pulses 2+ bilaterally.   CLINICAL FINDINGS:  EKG shows AV sequential pacemaker.   ASSESSMENT AND PLAN:  1. Coronary artery disease.  The patient appears to be doing well from      her standpoint.  She does  not describe any episodes of chest pain.      She does report this clicking that she could hear yesterday,      although this is fairly non-descriptant, difficult to ascertain      exactly what was going on.  I reassured her that her pacemaker is      functioning normally.  Continue her medications.  2. History of sick sinus syndrome status post pacemaker placement,      normal functioning per interrogation today.  Reassurance.  3. Hypertension, stable.  4. Hyperlipidemia.  She is tolerating simvastatin therapy.  5. Hypothyroidism treated with Synthroid and saw the primary care.  6. Diabetes mellitus.  The patient was recently takeoff metformin.      This followed by primary care.   DISPOSITION:  Follow up with Dr. Eden Emms in 3 months or sooner if  necessary.      Nicolasa Ducking, ANP  Electronically Signed      Doylene Canning. Ladona Ridgel, MD  Electronically Signed   CB/MedQ  DD: 05/08/2008  DT: 05/09/2008  Job #: [id number]

## 2010-07-30 NOTE — Cardiovascular Report (Signed)
[name], [name]                          ACCOUNT NO.:  [id number]   MEDICAL RECORD NO.:  [id number]                   PATIENT TYPE:  INP   LOCATION:  2926                                 FACILITY:  [facility]   PHYSICIAN:  Charlies Constable, M.D.                  DATE OF BIRTH:  [birth year]   DATE OF PROCEDURE:  04/14/2003  DATE OF DISCHARGE:                              CARDIAC CATHETERIZATION   PROCEDURES PERFORMED:   CARDIOLOGIST:  Charlies Constable, M.D.   CLINICAL HISTORY:  [name] is 77 years old and has had previous bypass  surgery in 1996, and has had n ejection fraction of 43% in the past; and,  has been followed by Dr. Eden Emms.  She also has had previous aortobifem  bypass surgery.  The patient was admitted on January 28th after blacking out  at home with seizure activity and was found to be in 2:1 AV block with heart  rates of 35.  Her EKG shoed left bundle branch block.  While in the hospital  she developed runs of nonsustained ventricular tachycardia.  The patient was  brought to the cath lab today for evaluation of possible ischemic etiology  for her AV block and nonsustained V-tach, prior to plan for a pacemaker  and/or ICD placement.   DESCRIPTION OF PROCEDURE:  The procedure performed via the right femoral  artery going through the distal limb of the aortobifem bypass graft.  A  front wall arterial puncture was performed.  Omnipaque contrast was used.  We used a LIMA catheter for injection of the LIMA graft.   The patient tolerated the procedure well and left the laboratory in  satisfactory condition.   RESULTS:   HEMODYNAMIC DATA:  The aortic pressure was 178/75 with a mean of 112.  Left  ventricular pressure 178/17.   ANGIOGRAPHIC DATA:  Left Main Coronary Artery:  The left main coronary  artery had a 40% mid stenosis.   left Anterior Descending Artery:  The left anterior descending artery gave  rise to three septal perforators and two diagonal branches.  The  LAD was  then occluded after these vessels.   Circumflex Artery:  The circumflex artery had an 80% ostial stenosis and  there was competing flow distally.   Right Coronary Artery:  The right coronary artery had a 90% proximal  stenosis and it was completely occluded right in the proximal portion after  a right ventricular branch.   Grafts:  The saphenous vein graft to the posterior descending branch of the  right coronary artery was patent and functioned normally.   The saphenous vein graft to the marginal branch of the circumflex artery was  patent and functioned normally.  There was 90% proximal to the vein graft  insertion site, which compromised two posterolateral branches.   The LIMA graft to the LAD was patent and functioned normally. There was 40%  narrowing in  the LAD after the insertion site.   VENTRICULOGRAPHIC DATA:  Left Ventriculogram:  The left ventriculogram  performed in the RAO projection showed hypo- to akinesis of the mid inferior  wall out to the apex.  The estimated ejection fraction was 35-40%.   Left Ventriculogram:  The left ventriculogram performed in the LAO  projection showed hypo- to akinesis of the tip of the apex and inferoapical  segment.   CONCLUSION:  1. Coronary artery disease status post coronary bypass graft surgery in     1996.  2. Severe native vessel disease with total occlusion of the left anterior     descending and right coronary arteries, and 80% ostial stenosis in the     circumflex artery with 90% stenosis in the first marginal branch, and 40%     narrowing in the left main coronary artery.  3. Patent vein graft to the posterior descending branch of the right     coronary artery, patent vein graft to the marginal branch of the     circumflex artery and patent left internal mammary artery graft to the     left anterior descending.  4. Inferior-apical wall hypo- akinesis with an estimated ejection fraction     of 35-40%.    RECOMMENDATIONS:  The patient has no major source of ischemia.  The two  posterolateral branches of the circumflex artery potentially are not getting  adequate circulation because of compromise from the lesion in the proximal  marginal graft proximal to the vein graft insertion site.  This does not  appear to be a recent change and I doubt that it is causing her recent  problem.  Her major problem appears to be second degree AV block for which  she will need a pacemaker.  She may also require an ICD for nonsustained V-  tach, and Dr. Graciela Husbands will see her in consultation and probably arrange for  her to have EP studies prior to the decision of pacemaker versus  pacemaker/ICD.                                               Charlies Constable, M.D.    BB/MEDQ  D:  04/14/2003  T:  04/15/2003  Job:  [id number]   cc:   Altamease Oiler C. Merilynn Finland, M.D.  Fam. Med - Resident - [place], [place] [address]  Fax: [phone]   Charlton Haws, M.D.   Cardiopulmonary Laboratory

## 2010-07-30 NOTE — Discharge Summary (Signed)
[place]. [facility]  Patient:    [name], [name]                         MRN: [id number] Adm. Date:  08/23/99 Disc. Date: 09/25/99 Dictator:   Maryelizabeth Rowan, M.D.                           Discharge Summary  PRIMARY DIAGNOSIS:  Mental status changes.  SECONDARY DIAGNOSES:  1. Lithium toxicity.  2. Renal tubular acidosis.  3. hyperchloremia.  4. Respiratory distress secondary to metabolic acidosis.  5. Grand mal seizures secondary to toxic metabolic encephalopathy.  6. Acute respiratory failure.  7. Congestive heart failure.  8. Hypertension.  9. Anemia. 10. Malnutrition. 11. Deconditioning. 12. Euthyroid sick syndrome. 13. Diabetes mellitus. 14. History of breast cancer. 15. Ovarian cancer. 16. Bipolar. 17. Sundowning. 18. Contact dermatitis. 19. Urinary tract infection. 20. Old left bundle branch block.  HOSPITAL COURSE:  This 77 year old white female admitted for worsening function and mental status changes found to have lithium toxicity.  This progressed to metabolic acidosis, respiratory distress, and grand mal seizures.  She had an extensive hospitalization including respiratory failure and intubation on August 27, 1999.  The day before intubation on August 26, 1999 neurology consult, Drs. Hickling and [facility], felt that patient most likely had toxic metabolic encephalopathy. Blood cultures on that day grew out gram-positive cocci in two specimens and coagulase-negative staph.  Patient was started on vancomycin for 14 days. There is no "clear source" of this infection.  #1 - LITHIUM TOXICITY:  Upon admission, a lithium level came back at 2.89.  On day #2 of admission, patient developed metabolic acidosis, pH 6.91, hypoxia, bradycardia, and hyperkalemia with potassium 6.0.  It is felt that her lithium toxicity caused the renal tubular acidosis, which, in turn, caused the hyperchloremia which caused her respiratory distress secondary to  the metabolic acidosis.  Patient was transferred to the ICU for further supportive care.  Patient was intubated on August 27, 1999 for increased work of breathing and decreasing O2 saturations.  #2 - GRAND MAL SEIZURE:  Patient had a grand mal seizure on day #3 of hospitalization.  Patient received supportive care.  Also serum screen for heavy metals at that time was negative.  #3 - SEPSIS:  Blood cultures obtained on admission were positive for gram-positive cocci and coagulase-negative staph.  Patient was treated with IV vancomycin for 14 days.  There was no clear source found for this sepsis.  #4 - RESPIRATORY DISTRESS:  Patient was intubated on August 27, 1999 for increased work of breathing.  This was felt to be secondary to her metabolic acidosis caused by her lithium toxicity.  Patient remained intubated for 10 days and received a trach on September 06, 1999.  Due to some significant airway and cord edema she was weaned off the vent over about a 25-hour time period. This trach was removed about one week prior to her discharge.  #5 - EUTHYROID SICK SINUS SYNDROME:  Patient was found to have a low Free T3 and T4 and was started on Synthroid 25 mEq q.d.  Her TSH was within normal limits.  Therefore, she was diagnosed with a euthyroid sick syndrome and it is felt that patient may not need the Synthroid replacement long term.  Will need close followup regarding this matter.  #6 - ANEMIA OF CHRONIC DISEASE:  Patient received two units of  packed red blood cells transfusion on September 07, 1999 for a hemoglobin that reached an all time low of 7.7.  Patient was also started on Epogen at that time.  #7 - OVARIAN CANCER:  Patient was found to have elevated CA 125 at 74.6. Further imaging was done with a CT of the pelvis which revealed a 7.2 x 4.3 cm multiseptated cystic mass of the left ovary with a normal-appearing right ovary.  Patient was referred to gynecological oncology for further follow  up as an outpatient.  #8 - BIPOLAR:  Patient has a history of bipolar illness.  Dr. Jeanie Sewer was consulted during this hospitalization.  It was found that the patient had both bipolar illness, dementia, and sundowning.  Patients antipsychotics had been held while patient was on the ventilator and before discharge medications of Risperdal, Effexor, and Ativan were resumed.  #9 - URINARY TRACT INFECTION:  Patient had a UTI as she had Foley placement for almost three weeks.  Her urine culture grew out Klebsiella and E. coli which was sensitive to Tequin.  She was treated with seven days of this antibiotic.  CONSULTANTS:  1. Oley Balm Sung Amabile, M.D. [facility]  2. ENT.  3. Cardiology- Madaline Savage, M.D.  4. Pulmonology/critical care - Barbaraann Share, M.D. [facility]  5. Neurology - Deanna Artis. Sharene Skeans, M.D. and Gustavus Messing. Orlin Hilding, M.D.  6. ID - Dr. Orvan Falconer.  7. Psychiatry - Adelene Amas. [place], M.D.  PROCEDURES:  1. August 23, 1999, head CT with no acute bleed and mild cerebral atrophy.  2. LP done August 25, 1999 within normal limits.  3. August 27, 1999, patient was intubated with endotracheal tube as well as NG     tube placement.  4. August 25, 1999, patient had an ART line placement.  5. August 28, 1999, central line placement in the left IJ.  This line was     discontinued on September 02, 1999.  Thrombus appeared on August 30, 1999 in the     IJ catheter and then catheter discontinued on September 02, 1999.  6. August 30, 1999, patient received upper extremity Doppler for left arm     swelling with no significant findings.  7. Echocardiograms were obtained on August 26, 1999 which showed an ejection     fraction of 35-40%.  This was repeated on September 06, 1999 which showed an     ejection fraction within normal limits, however, of note, was mild     tricuspid and pulmonic regurgitation.  8. August 26, 1999, lower extremity Dopplers were negative for DVT.  9. August 26, 1999, also MRI/MRA showed no significant  abnormalities, did have     atrophy and small-vessel disease. 10. August 26, 1999, EEG also revealed no focal abnormality but was suggestive      for encephalopathy. 11. September 04, 1999, Panda tube was placed for feeding. 12. On September 05, 1999, CT of abdomen, pelvis, chest, revealed 7.2 cm     multiseptated cystic mass on the left ovary. 13. On September 06, 1999, patient underwent trach tube placement.  DISCHARGE CONDITION:  Patient was discharged in stable condition to [facility] for inpatient rehabilitation.  DISCHARGE MEDICATIONS:  1. Effexor XR 225 mg p.o. q.d.  2. Pepcid 20 mg p.o. b.i.d.  3. Hydrocortisone ointment b.i.d. to right arm for contact dermatitis.  4. Risperdal 1 mg p.o. q.d.  5. Ramipril 2.5 mg p.o. b.i.d.  6. Synthroid 25 mcg p.o. q.d.  7. Enteric-coated aspirin 325 mg p.o.  q.d.  8. Trental 400 mg p.o. t.i.d.  9. Haldol 1 mg p.o. t.i.d.  FOLLOWUP:  Patient was instructed to follow up with primary care physician Dr. Amparo Bristol [facility] as well as Dr. ___________ hematology-oncology at [facility]. DD:  03/29/00 TD:  03/31/00 Job: [id number]

## 2010-07-30 NOTE — Op Note (Signed)
NAMERUHANI, UMLAND                          ACCOUNT NO.:  [id number]   MEDICAL RECORD NO.:  [id number]                   PATIENT TYPE:  INP   LOCATION:  2926                                 FACILITY:  [facility]   PHYSICIAN:  Duke Salvia, M.D.               DATE OF BIRTH:  12/25/1923   DATE OF PROCEDURE:  04/14/2003  DATE OF DISCHARGE:                                 OPERATIVE REPORT   PREOPERATIVE DIAGNOSIS:  Syncope with nonsustained ventricular tachycardia,  ischemic heart disease, and depressed left ventricular function, prior  bypass surgery.   POSTOPERATIVE DIAGNOSIS:  Syncope with nonsustained ventricular tachycardia,  ischemic heart disease, and depressed left ventricular function, prior  bypass surgery.   PROCEDURE:  Electrophysiological study.   PROCEDURE IN DETAIL:  Following the obtaining of informed consent, the  patient was brought to the electrophysiology laboratory and placed on the  fluoroscopic table in the supine position.  After routine prep and drape,  cardiac catheterization was performed with local anesthesia and conscious  sedation.  Noninvasive blood pressure monitoring and transcutaneous oxygen  saturation monitoring were performed continuously throughout the procedure.  Following the procedure, the catheters were removed.  Hemostasis was  obtained, and the patient was then prepared for pacemaker implantation in a  stable condition.   The catheter was a 5 [place] quadripolar catheter.  It was inserted via the  left femoral vein to the RV apex and subsequently moved to the right  ventricular outflow tract.   Surface leads 1, AVF, and V1 were monitored continuously throughout the  procedure.  Following insertion of the catheters and stimulation protocol  included, single and double ventricular electrical stimuli from the right  ventricular apex at a pace cycle length of 400:600 msec and double and  triple ventricular extra stimuli from the right  ventricular apex and the  right ventricular outflow tract at a paced cycle length of 600 and 400 msec.   RESULTS:  Surface electrocardiogram:   Rhythm is sinus with heart block.   AA interval was 775 msec.   Paced QRS duration was 193 msec with a QT interval of 521 msec.   Ventricular response to program stimulation:   The __________at the right ventricular apex at a paced cycle length of 600  msec was 270 msec and at 400 msec was 260 msec.   The __________ right ventricular outflow tract at a paced cycle length of  600 msec was 290 msec and at the 400 msec was 280 msec.   ___________ intervals.  The right ventricular apex had a paced cycle length  of 600 msec was 280:240:210 and at 400 msec was 270:210:200.   ___________ interval.  The right ventricular outflow tract had a paced cycle  length of 600 msec with 320:280:250 and at 400 msec was 300:280:250.   Arrhythmias induced:  none.   IMPRESSION:  1. Complete heart block.  2. Left  bundle branch block.  3. No inducible ventricular tachycardia.   RECOMMENDATIONS:  Based on the above, I would infer that the syncope is  related to the heart block.  The ventricular tachycardia was consistent with  [place].  Therefore, we will proceed with pacemaker implantation.                                               Duke Salvia, M.D.    SCK/MEDQ  D:  04/14/2003  T:  04/14/2003  Job:  [id number]   cc:   Electrophysiology Laboratory   [facility]   [place] C. Merilynn Finland, M.D.  Fam. Med - Resident - [place], [place] [address]  Fax: [phone]

## 2010-07-30 NOTE — Op Note (Signed)
[place]. [facility]  Patient:    [name], [name]                       MRN: [id number] Proc. Date: 09/06/99 Adm. Date:  [id number] Attending:  McDiarmid, Leighton Roach.                           Operative Report  PREOPERATIVE DIAGNOSIS:  Ventilator dependency.  POSTOPERATIVE DIAGNOSIS:  Ventilator dependency.  PROCEDURE:  Tracheostomy.  SURGEON:  Jefry H. Pollyann Kennedy, M.D.  ASSISTANT:  ANESTHESIA:  General endotracheal anesthesia was used.  COMPLICATIONS:  None.  ESTIMATED BLOOD LOSS:  10 cc.  DISPOSITION:  The patient tolerated the procedure well and was transferred back to the coronary unit in stable condition.  INDICATIONS:  This is a 77 year old lady with a history of being intubated for 10 days.  She failed multiple attempts at extubation.  The risks, benefits, alternatives, and complications of the procedure were explained to the family, who seemed to understand and agreed to surgery.  DESCRIPTION OF PROCEDURE:  The patient was taken to the operating room and placed on the operating table in the supine position.  Following initiation of intravenous anesthesia, the neck was prepped and draped in standard fashion. The patient had previously been orally intubated.  A vertical incision overlying the upper trachea was accomplished with a 15 scalpel. Electrocautery was used to dissect through subcutaneous tissues and divide the fascia of the superficial muscles.  The diastasis of the strap muscles was divided.  A large anterior vein was ligated with silk ties and divided.  The dissection stayed below the thyroid gland.  The fascia was cleaned off of the trachea.  A horizontal tracheotomy incision was created and a lower tracheal flap was developed as well.  The lower tracheal flap was sutured to the lower cervical skin with a 2-0 chromic suture.  A #8 cuff tracheostomy tube was inserted without difficulty.  The cuff was inflated.  The shield was secured in  place using a 3-0 nylon suture and a Velcro tracheostomy strap was applied. The patient was then returned to the coronary unit in stable condition. DD:  09/06/99 TD:  09/07/99 Job: [id number]

## 2010-07-30 NOTE — Op Note (Signed)
[name], [name]                          ACCOUNT NO.:  [id number]   MEDICAL RECORD NO.:  [id number]                   PATIENT TYPE:  INP   LOCATION:  2926                                 FACILITY:  [facility]   PHYSICIAN:  Duke Salvia, M.D.               DATE OF BIRTH:  09-30-1923   DATE OF PROCEDURE:  04/14/2003  DATE OF DISCHARGE:                                 OPERATIVE REPORT   PREOPERATIVE DIAGNOSIS:  Syncope with high-grade heart block without  inducible ventricular tachycardia in a setting of ischemic heart disease,  depressed left ventricular function, and nonsustained ventricular  tachycardia.   POSTOPERATIVE DIAGNOSIS:  Syncope with high-grade heart block without  inducible ventricular tachycardia in a setting of ischemic heart disease,  depressed left ventricular function, and nonsustained ventricular  tachycardia.   PROCEDURE:  Dual-chamber pacemaker implantation and contrast  ventriculography.   PROCEDURE IN DETAIL:  Following the obtaining of informed consent, the  patient was then prepared for pacemaker implantation in a stable condition.  After routine prep and drape of the left upper chest, lidocaine was  infiltrated in the prepectoral subclavicular region. An incision was made  and carried down all the way to the prepectoral fascia using electrocautery  and sharp dissection.  A pocket was formed similarly.  Hemostasis was  obtained.   Thereafter, attention was turned to gain access to the extrathoracic left  subclavian vein, which was accomplished without difficulty with the  aspiration of air or puncture of the artery.  Two separate venipunctures  were accomplished, and guide wires were placed and retained, and a 0 silk  suture was placed in a figure-of-eight fashion and allowed to hang loosely.   Subsequently, 7 French tear-away introducer sheaths were placed, which were  then passed sequentially.  A Medtronic 5076 52 cm active fixation  ventricular  lead, serial #[id number] and a Medronic 45 cm active fixation  atrial lead, model 5076, serial #[id number].  Under fluoroscopic guidance,  these were manipulated to the right ventricular apex and the right atrial  appendage, respectively, where the bipolar paced R wave was between 9 and 14  mV with a pace impedance of 764 ohms and a pacing threshold of 0.5 volts at  0.5 msec and current threshold of 0.9 mA.  There was no diaphragmatic pacing  at 10 volts.   The bipolar P-wave was 2 mV with a pace impedance of 792 ohms and a pacing  threshold of 1.4 volts at 0.5 msec with a current threshold of 2.2 mA.  Again, there was no diaphragmatic pacing at 10 volts.   With these acceptable parameters recorded, the leads were secured to the  prepectoral fascia, and the hemostatic suture was secured, and the leads  were then attached to a Kappa KDR 901 Medtronic pulse generator, serial  #[id number].  Ventricular pacing and __________ pacing were identified.  Please note that the VENTRICULAR LEAD WAS  MARKED WITH A TIE.  The pocket was  copiously irrigated with antibiotic-containing saline solution.  Hemostasis  was again assured.  The leads in the pulse generator were placed into the  pocket and secured to the prepectoral fascia, and the wound was then closed  in three layers in the normal fashion.  The wound was  washed dry, and a Benzoin and Steri-Strip dressing was applied.  Needle  counts, sponge counts, and instrument counts were correct at the end of the  procedure, according to the staff.   The patient's device was programmed at 60-120 in the DDD mode.                                               Duke Salvia, M.D.    SCK/MEDQ  D:  04/14/2003  T:  04/14/2003  Job:  [id number]   cc:   Electrophysiology Laboratory   [facility]   [employer] C. Merilynn Finland, M.D.  Fam. Med - Resident - [place], [place] [address]  Fax: [phone]

## 2010-07-30 NOTE — Discharge Summary (Signed)
[place]. [facility]  Patient:    [name], [name]                       MRN: [id number] Adm. Date:  [id number] Disc. Date: [id number] Attending:  McDiarmid, Leighton Roach. Dictator:   Maryelizabeth Rowan, M.D. CC:         FAX:  Rande Brunt. Clarke-Pearson, M.D.- [facility] - GYN/Oncology &             Contact person:  [phone]             Amparo Bristol, M.D. & CT of pelvix & lab CA125                           Discharge Summary  DATE OF BIRTH:  1923/06/28  CHIEF COMPLAINT:  The patient presented to the hospital with mental status changes, and respiratory insufficiency.  PRIMARY DIAGNOSIS:  Lithium toxicity.  SECONDARY DIAGNOSES:  1. Congestive heart failure.  2. Hypertension.  3. Mental status changes.  4. Anemia.  5. Malnutrition.  6. Euthyroid syndrome.  7. Ovarian mass with elevated CA125.  8. History of diabetes mellitus.  9. Bipolar illness. 10. Sundowning. 11. Deconditioning. 12. Urinary tract infection.  HOSPITAL COURSE: The patient presented as noted previously.  The patients respiratory status declined and the patient was intubated and placed on the ventilator.  She was on the ventilator for approximately 10 days, and after which the patient underwent a tracheostomy.  The patients respiratory status continued to improve, and the tracheostomy was removed on September 25, 1999.  LABORATORY DATA:  CT of the abdomen and pelvis was obtained that showed an ovarian mass.  The follow-up laboratory of the CA125 was elevated.  CONSULTATION:  Pulmonology, Dr. Onalee Hua B. Simonds.  DISPOSITION:  After approximately five days in the [facility] with speech and occupational therapy, the patient was discharged home, in the care of her daughter.  DISCHARGE MEDICATIONS:  1. Protonix 40 mg q.d.  2. Effexor XR 225 mg q.d.  3. Haldol 2 mg q.h.s.  4. Synthroid 0.025 mg q.d.  5. Risperdal 1 mg q.d.  6. Hydrocortisone ointment applied to the right antecubital area p.r.n.  7.  Glucophage 500 mg b.i.d.  8. Vasotec 10 mg b.i.d.  9. Desitin cream to buttocks b.i.d. 10. Aspirin 81 mg q.d. 11. Humibid LA 600 mg b.i.d. p.r.n. cough. 12. Nitroglycerin sublingual p.r.n. chest pain. 13. Lasix 40 mg q.d. 14. K-Dur 20 mEq q.d. 15. Trental 400 mg t.i.d.  DISCHARGE/FOLLOW-UP INSTRUCTIONS: 1. Dr. Amparo Bristol on October 15, 1999. 2. Dr. Rande Brunt. Clarke-Pearson, gynecologist/oncologist. 3. Activity of walking with assistance. 4. Diabetic diet. 5. [facility] nurse and physical therapist, to follow up in    the home. DD:  10/16/99 TD:  10/16/99 Job: [id number]

## 2010-07-30 NOTE — Discharge Summary (Signed)
[name], [name]                          ACCOUNT NO.:  [id number]   MEDICAL RECORD NO.:  [id number]                   PATIENT TYPE:  INP   LOCATION:  2013                                 FACILITY:  [facility]   PHYSICIAN:  Gantt Bing, M.D.               DATE OF BIRTH:  12-20-1923   DATE OF ADMISSION:  04/11/2003  DATE OF DISCHARGE:  04/19/2003                                 DISCHARGE SUMMARY   PROCEDURES:  1. Dual chamber pacemaker implantation and contrast ventriculography.  2. Cardiac catheterization.  3. Coronary arteriogram.  4. Left ventriculogram.  5. Angiogram.  6. Left internal mammary artery arteriogram.   HOSPITAL COURSE:  [name] is a 77 year old female with known coronary  artery disease.  She had an episode of loss of consciousness associated with  seizure activity and respiratory arrest.  She is followed by Dr. Charlton Haws in the office for management of cardiovascular risk factors.  She had  bypass surgery in 1994, but had not had any major cardiac issues since that  time.  Her heart rate was initially 56 by EMS, but she was found to have  second degree heart block and had a heart rate in the 30's during transport.  She was not hypotensive.  She was admitted for further evaluation and  treatment.   An additional rhythm problem was some non-sustained VT.  Because of the  second degree heart block, she had a temporary transvenous pacer inserted at  a heart rate of 70 and tolerated this procedure well.  Because she had  bypass surgery ten years ago, there was concern for an ischemic cause of her  symptoms and she was scheduled for a cardiac catheterization.  Her enzymes  were negative for MI, but she was taken to the catheterization laboratory on  April 14, 2003.  The cardiac catheterization showed severe native three  vessel disease with patent SVG to OM, SVG to PDA and LIMA to LAD.  There was  a 40% stenosis distal to the anastomosis of the LIMA to  LAD.  Dr. Charlies Constable reviewed the films and felt that there was not much of a source of  ischemia.  Her EF was 35 to 40% with apical hypokinesis and akinesis.  It  was felt that an EP evaluation was needed.   [name] was seen by Dr. Duke Salvia, who felt that she needed a  pacemaker and this was implanted on April 14, 2003.  A Kappa KDR 901 dual  lead pacemaker was implanted without complication.  Blood pressure control  was excellent and she tolerated both procedures well.  On a follow-up  pacemaker check, the pacemaker was functioning normally with no episodes  noted, natural sensing and depacing at 100%.   [name] had been on multiple medications for psychiatric issues and she  is followed regularly at [facility].  These  medications  were initially held because of the bradycardia, but once the pacemaker was  inserted they were restarted.  It was also felt that she would need  increased beta blockade and therefore the ACE inhibitor was decreased.  The  propranolol was increased to 10 mg t.i.d., but in order to tolerate this,  the Vasotec was decreased from 10 mg b.i.d. to 5 mg b.i.d.  She tolerated  both medications well and her systolic blood pressure was in the 120's.   By April 19, 2003 she had been seen by PT and OT, who recommended home  physical therapy and this was ordered.  She had a rolling walker and was  ambulating with this.  [name] was considered stable for discharge on  April 19, 2003 and is to follow-up as an outpatient.   LABORATORY VALUES:  Hemoglobin 11.6, hematocrit 33.2, WBC's 9.3, platelets  181.  Sodium 135, potassium 3.6, chloride 103, CO2 26, BUN 13, creatinine  1.0, glucose 110.  Calcium 7.9, magnesium 1.9.  TSH 2.538.  Total  cholesterol 133, triglycerides 139, HDL 40, LDL 65.   2D echocardiogram; EF is approximately 40 to 45% with inferior and septal  hypokinesis.  No critical valvular abnormalities are noted.   There is no  effusion.   Chest x-ray; the temporary pacemaker is in the right ventricular apex.  No  pneumothorax, mild edema.   DISCHARGE CONDITION:  Improved.   DISCHARGE DIAGNOSES:  1. Syncope with second degree Mobitz II A-V block, status post Kappa     permanent pacemaker this admission.  2. Status post aortic coronary bypass surgery in 1994 with LIMA to LAD, SVG     to OM and SVG to PAD.  3. Hypertension.  4. Hyperlipidemia.  5. Non-insulin-dependent diabetes mellitus, diet control only.  6. Hypothyroidism.  7. Remote history of tobacco use.  8. Psychiatric disorders, including bipolar disease, possible early     dementia.  9. Family history of premature coronary artery disease.  10.      Non-sustained ventricular tachycardia.  11.      Peripheral vascular disease, surgery per Dr. Kristen Loader. Early.  12.      Hypokalemia.  13.      Gastroesophageal reflux disease.  14.      Allergies to CODEINE, MORPHINE and LATEX, with intolerance to     TOPROL, ENALAPRIL, __________, ALLOPURINOL, ESKALITH, RISPERDAL,     SEROQUEL, BENZTROPINE, ZYPREXA, BUSPAR and high doses of PERPHENAZINE.   DISCHARGE INSTRUCTIONS:  1. Activity is to be per the pacer discharge sheet.  2. She is to stick to diets low in fat, salt and cholesterol and diabetic     diet.  3. She is to follow-up with [facility] and Dr. Merilynn Finland,     as needed or as scheduled.  4. She is to follow-up with Dr. Charlton Haws and make an appointment when     she comes for her pacer follow-up appointment.  5. She is to be seen in the pacer clinic at [facility] on February 14 at 9:45     a.m.  6. She is to follow-up with Dr. Duke Salvia on May 11 at 11:10 a.m.   DISCHARGE MEDICATIONS:  1. Synthroid 25 mcg q. day.  2. K-Dur 20 mEq q. day.  3. Protonix 40 mg q. day.  4. Lipitor 40 mg q. day.  5. Vasotec 5 mg b.i.d.  6. Aspirin 81 mg q. day.  7. Ativan 0.5 mg q.h.s.  8. Lasix 40 mg q. day.  9. Wellbutrin  450 mg q. day. 10.      Trental 40 mg t.i.d.  11.      Aerosept 10 mg q.h.s.  12.      Zoloft 100 mg q.h.s.  13.      Abilify 10 mg q.h.s.  14.      Propranolol 10 mg t.i.d.  15.      Perphenazine 4 mg q.h.s.      Theodore Demark, P.A. [facility]                  Oneida Bing, M.D.    RB/MEDQ  D:  04/19/2003  T:  04/21/2003  Job:  [id number]   cc:   Charlton Haws, M.D.   Dr. Alvira Philips [facility]

## 2010-07-30 NOTE — Consult Note (Signed)
[facility]  Patient:    [name], [name]                         MRN: [id number] Proc. Date: 03/28/00 Attending:  Rande Brunt. Clarke-Pearson, M.D. CC:         Sibyl Parr. Darrick Penna, M.D.  Telford Nab, R.N.  Mena Pauls, M.D.  Maryelizabeth Rowan, M.D.   Consultation Report  HISTORY:  We have just received the CT scan report ordered by Dr. Royal Hawthorn B. Fields on Mrs. Waskey in followup of her complex adnexal mass.  As recalled, the patient is a 77 year old white female who was found to have a pelvic mass in the course of workup for multiple medical problems including coronary artery disease, congestive heart failure, deconditioning, renal artery stenosis, anemia and malnutrition.  It was felt the patient was not a surgical candidate and the patient declined fine-needle aspirate to determine cytology; we therefore elected to follow this mass.  The CT scan obtained on January 10th is reviewed and shows no evidence of metastatic disease in the upper abdomen.  The CT imaging of the pelvis shows an essentially stable multiloculated left ovarian cystic mass measuring 7.2 x 6.3 x 6.0 cm.  There is no adenopathy or free fluid or peritoneal implants.  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  Given the patients medical condition, I still do not believe she is a surgical candidate and would therefore recommend she have a repeat CT scan again in six months to determine that this is a stable neoplasm.  DD:  03/28/00 TD:  03/28/00 Job: [id number]

## 2010-07-30 NOTE — Consult Note (Signed)
[facility]  Patient:    [name], [name]                       MRN: [id number] Proc. Date: 10/19/99 Adm. Date:  [id number] Attending:  Jeannette Corpus CC:         Amparo Bristol, M.D.  Telford Nab, R.N.  Mena Pauls, M.D.  Maryelizabeth Rowan, M.D.   Consultation Report  HISTORY OF PRESENT ILLNESS:  [age]-year-old white female referred from the family medicine service at [facility] for evaluation of a newly diagnosed pelvic mass.  The patient was admitted to [facility] in respiratory failure on September 25, 1999.  She was intubated and required a tracheostomy but, with respiratory management, she was ultimately extubated, and a tracheostomy has been reversed.  The reason for her pulmonary failure remains unknown at the present time.  In the course of workup, the patient underwent a CT scan, an ultrasound of the abdomen and pelvis.  The CT scan revealed a 7.2 x 4.3 cm multiseptated mass in the left ovary with a normal-appearing right ovary.  There was no evidence of free fluid, adenopathy, or other intraperitoneal abnormalities.  The patient has also had an ultrasound which essentially confirms these findings.  She denies any GI or GU symptoms.  Has no pelvic pain or pressure, vaginal bleed, or discharge.  She has a good appetite, and her weight has been stable.  PAST MEDICAL HISTORY: 1. Breast cancer approximately 16 years ago. 2. Coronary artery disease, status post CABG seven years ago. 3. Apparently, renal artery stenosis with repair (per the patients history). 4. Congestive heart failure. 5. Anemia. 6. Malnutrition. 7. History of diabetes. 8. Bipolar disease. 9. Deconditioning.  PAST SURGICAL HISTORY: 1. Coronary artery bypass. 2. Cholecystectomy. 3. Aortobifemoral bypass.  DRUG ALLERGIES:  CODEINE, MORPHINE, and PLASTICS.  CURRENT MEDICATIONS:  Aspirin, Effexor, enalapril, Glucophage b.i.d.,  Haldol, Humibid, K-Dur, Lasix p.r.n.  Nitroglycerin p.r.n.  Protonix daily. Risperdal, Synthroid, and Trental.  PHYSICAL EXAMINATION:  VITAL SIGNS:  Height 5 feet 3 inches, weight 138 pounds.  Blood pressure 112/78, pulse 76, respiratory rate 18.  GENERAL:  The patient is a pleasant white female who is oriented and cooperative.  HEENT:  Negative.  NECK:  Supple.  Without thyromegaly.  The tracheostomy site is healing well.  ABDOMEN:  Soft and nontender.  No masses, organomegaly, ascites, or hernias are noted.  PELVIC:  EGBUS is normal.  Vagina is clean, well supported.  Bimanual examination reveals some fullness without any discrete masses.  Rectovaginal exam confirms.  IMPRESSION:  I have reviewed the patients CT scan and ultrasounds, which clearly show an ovarian neoplasm.  The real question is whether this is a benign or malignant lesion.  Given the patients medical status, I do not believe that surgical exploration of the abdomen and pelvis is warranted at the present time.  I discussed at length with the patient and her daughter the pros and cons of other management options including just continued ultrasound surveillance versus fine needle aspirate and cyst drainage submitted for cytology.  After considering the pros and cons of these, they would like to proceed with cyst drainage for cytology to assist in determining whether there is any evidence of malignancy present or not.  This will be arranged in the near future and will contact the patient with these results and make further recommendations.  We have arranged for the patient to have this performed on October 22, 1999, at 7:30 a.m. DD:  10/19/99 TD:  10/19/99 Job: [id number]

## 2010-07-30 NOTE — H&P (Signed)
NAMEKIMBERLEE, [name]                          ACCOUNT NO.:  [id number]   MEDICAL RECORD NO.:  [id number]                   PATIENT TYPE:  INP   LOCATION:  1828                                 FACILITY:  [facility]   PHYSICIAN:  [place] Bing, M.D.               DATE OF BIRTH:  1923-03-22   DATE OF ADMISSION:  04/11/2003  DATE OF DISCHARGE:                                HISTORY & PHYSICAL   PRIMARY CARE PHYSICIAN:  Noelle C. Merilynn Finland, M.D.   PRIMARY CARDIOLOGIST:  Charlton Haws, M.D.   HISTORY OF PRESENT ILLNESS:  A 77 year old woman with prior CABG surgery,  who presents with an episode of loss of consciousness associated with  seizure activity and respiratory arrest.  The patient was last hospitalized  in 2001.  At that time, she was not seen by cardiology.  Her cardiac status  was and has remained stable.  She is seen by Dr. Eden Emms in the office  approximately once a year for management of cardiovascular risk factors.  She has no chest discomfort.  She is sedentary, but reports no exertional  dyspnea.  Blood pressure and apparently lipids have been well controlled.  She was seated on a couch and was behaving in her usual fashion this  evening, when she was suddenly seen to lose consciousness and slump over.  She had tremulousness of her upper extremities and eyes and was unresponsive  for a period of five minutes.  Her daughter felt that she was apneic and  initiated CPR, but the patient subsequently regained consciousness prior to  arrival of the EMS.  The patient denies chest pain prior to or subsequent to  loss of consciousness.  She did have diaphoresis.  There was no emesis.  Heart rate was initially 56, but she subsequently became increasingly  bradycardia and was found to have second degree AV block.  She experienced  no symptoms with heart rates in the 30's during transport and subsequent to  her arrival in the emergency department.  She has not been found to be  hypotensive.   Details of cardiac history are not currently available.  She underwent CABG  surgery at [facility] in 1994 and has apparently not had major  cardiac issues since that time.  Several years earlier, Larina Earthly, M.D.  performed an abdominal vascular procedure that is varyingly described as an  aortobifemoral bypass graft or a graft to a renal artery.   The patient has hypertension that is well controlled. She has hyperlipidemia  that has been under continuous pharmacologic treatment.  She has a history  of diabetes, but currently has no diabetic medications on her list.  She  previously was treated with Metformin, but maintained absolutely normal  blood sugars.   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  She is said to have had congestive heart failure in the past,  but not recently.  She has smoked cigarettes in the remote past, but is not  felt to have significant COPD.  She has recently had a productive cough and  has been treated with a course of antibiotics for presumed bronchitis.  She  has anemia, malnutrition, and deconditioning in the past.  She has a  psychiatric disorder that has been described as bipolar disease.  Other than  her cardiac and vascular surgery, she has only had a cholecystectomy.   ALLERGIES:  CODEINE, MORPHINE.   CURRENT MEDICATIONS:  1. Levothyroxine 0.25 mg daily.  2. Potassium 20 mEq daily.  3. Wellbutrin 450 mg daily.  4. Pentoxifylline 400 mg t.i.d.  5. Lorazepam 0.5 mg q.h.s.  6. Propranolol 10 mg b.i.d.  7. Aricept 10 mg daily.  8. Zoloft 100 mg q.h.s.  9. Abilify 10 mg q.h.s.  10.      Perphenazine 4 mg q.h.s.  11.      Protonix 40 mg daily.  12.      Enalapril 10 mg b.i.d.  13.      Furosemide 40 mg daily.  14.      Atorvastatin 40 mg daily.  15.      Aspirin 81 mg daily.  16.      Ketek 400 mg  b.i.d. - started within the past few days.   FAMILY HISTORY:  Positive for coronary artery disease - her sister has had  percutaneous intervention.  She has many family members with a history of  neoplastic disease.   REVIEW OF SYSTEMS:  Gait is unsteady - she walks behind a wheelchair.  She  reports chronic weakness in her legs, but this may represent a diabetic  neuropathy.  She has had stable weight and appetite.  She has had a recent  productive cough and perhaps a low grade fever.  She has had multiple skin  cancers removed, otherwise, all systems reviewed and are negative.   PHYSICAL EXAMINATION:  GENERAL:  A pleasant woman with a somewhat flat  affect in no acute distress.  VITAL SIGNS:  Heart rate is 35 and regular, blood pressure 115/60,  respirations 20, temperature 98.  HEENT:  Anicteric sclerae.  NECK:  No jugular venous distention; no carotid bruits.  ENDOCRINE:  No thyromegaly.  HEMATOPOIETIC:  NO adenopathy.  SKIN:  No significant lesions.  LUNGS:  Coarse breath sounds at the bases; expiratory rhonchi.  HEART:  Normal first and second heart sounds; modest systolic murmur.  ABDOMEN:  Soft and nontender; no organomegaly, normal bowel sounds.  EXTREMITIES:  No edema, 1 to 2+ distal pulses.  NEUROMUSCULAR:  Normal strength and tone, normal coordination.  Full range  of motion.   EKG; sinus rhythm with 2 to 1 AV block, left bundle branch block.   LABORATORY DATA:  Initial laboratory notable for a normal CBC, borderline  troponin of 0.1 to 0.18 with normal CPK-MB and myoglobin and normal  chemistry profile except for a glucose of 204.   IMPRESSION:  The patient has known coronary artery disease, unknown left  ventricular systolic function, and no symptoms to suggest an acute ischemic  syndrome or myocardial infarction.  Serial cardiac markers will be obtained.  She will be observed at bed rest in the CCU or stepdown unit.  If she becomes symptomatic relative to her  bradycardia, a temporary pacing wire  will be placed.  The nature of her initial symptoms  is not clear.  She  probably did not have a prolonged loss of consciousness with seizure  activity based upon 2 to 1 AV block and a heart rate in the 30's. She did  have some coughing prior to her event, which may have increased vagal tone  and transiently decreased heart rate.  Alternatively, she may have had a  ventricular tachyarrhythmia related to her excessive bradycardia.  Propranolol, of course, will be discontinued; however, the dosage she is  taking is not likely significantly effecting conduction.   She appears to have frank diabetes.  CBG's will be monitored and appropriate  therapy instituted.  Thyroid function will be assessed and her dose of  thyroid replacement adjusted accordingly.  Her cardiac medications including  ACE inhibitor and diuretic will be held for now.  Lipid lowering therapy  will be continued.  I am concerned about giving her both Wellbutrin and  Zoloft - the latter drug will be held.  Otherwise, her usual medications  will be continued along with an additional three-day course of Ketek.   Arrangements will be made for permanent pacing as soon as this procedure can  be arranged.                                                [place] Bing, M.D.    RR/MEDQ  D:  04/12/2003  T:  04/12/2003  Job:  [id number]

## 2010-07-30 NOTE — H&P (Signed)
[facility]  Patient:    [name], [name]                       MRN: [id number] Adm. Date:  [id number] Attending:  McDiarmid, Leighton Roach. Dictator:   _______ Wilma Flavin, M.D. CC:         Jamey Reas, M.D.                         History and Physical  DATE OF BIRTH:  [birth year].  CHIEF COMPLAINT: Confusion and decreased function.  HISTORY OF PRESENT ILLNESS:  This is a 77 year old female with recent significant mental status changes including recent fall and slurred speech, and unable to perform activities of daily living.  In addition, has become incontinent of stool times two days, with worsening tremor, recurrent falls and is significantly more difficult to take care of her family.  There is no sign of infection or cause of delirium.  Seen by neurology recently and had no further work-up at this time.  PAST MEDICAL HISTORY:  1. Diabetes mellitus type 2.  2. Hypercholesterolemia.  3. Major depression.  4. Anxiety with paranoid component.  5. Coronary artery disease.  6. Claudication.  7. Hemorrhoids.  8. History of psychosis.  9. Breast cancer, status post mastectomy. 10. History of tobacco abuse.  REVIEW OF SYSTEMS:  Denies fever, chills, weight loss or weight gain.  Positive  decreased p.o. intake.  Positive difficulty swallowing meds today.  No shortness of breath.  Positive cough.  No chest pain.  No vomiting.  Positive incontinence.  Positive dysphagia.  Positive weakness.  No focal deficits.  Positive paranoia nd physical hallucinations.  ALLERGIES: 1) CODEINE.  2) MORPHINE.  PHYSICAL EXAMINATION:  GENERAL:  A disheveled elderly female, poor eye contact and difficulty following commands and jerking; mildly ill-appearing - oriented only to self and lethargic.  HEENT:  The patient is edentulous.  Conjunctivae and lids are normal. Extraocular eye muscles intact.  Oropharynx is slightly dry.  NECK:  Without masses or  thyromegaly.  LUNGS:  Poor inspiratory effort but clear to auscultation.  HEART:  Distant heart sounds; regular rate and rhythm.  ABDOMEN:  Soft, positive bowel sounds.  EXTREMITIES:  Showing ecchymosis on the right arm.  NEUROLOGIC:  Limited secondary to cooperation, but nonfocal.  Strength is 5/5 bilaterally.  Tremor and head jerks intermittently.  ASSESSMENT/PLAN: 1. Mental status changes. The patient will be admitted for systemic work-up secondary to significant changes since her last visit; etiology is unclear at this time as there are multiple possibilities, including medication overdose or insufficient psychotropic medications, vascular incident (including CVA or TIA), lithium toxicity, serotonin syndrome or trauma and potential intracranial bleed.  Will check a comprehensive metabolic panel, CBC, TSH, lithium level, CT of the head and hold sedative medications and lithium until levels are back. 2. Dehydration. Gently hydrate and adjust according to electrolytes. 3. Disposition. The patient may be a likely candidate for placement if family agrees that she is not significantly improved.  We will follow this. 4. Code Status. Will discuss this with the family. DD:  08/23/99 TD:  08/23/99 Job: [id number]

## 2010-07-30 NOTE — Discharge Summary (Signed)
[place]  Patient:    [name], [name]                       MRN: [id number] Adm. Date:  [id number] Disc. Date: [id number] Attending:  McDiarmid, Leighton Roach. Dictator:   Maryelizabeth Rowan, M.D.                           Discharge Summary  NO DICTATION. DD:  10/16/99 TD:  10/18/99 Job: [id number]

## 2010-07-30 NOTE — Discharge Summary (Signed)
[place]  Patient:    [name], [name]                       MRN: [id number] Adm. Date:  [id number] Disc. Date: [id number] Attending:  Doneta Public Dictator:   Kinnie Scales Priebe                           Discharge Summary  DISCHARGE DIAGNOSES:  1. Transient ischemic attack versus Lithium toxicity.  2. Tremors.  3. Left bundle branch block.  4. Diabetes mellitus.  5. Anxiety.  6. Increased cholesterol.  7. Coronary artery disease.  8. Peripheral vascular disease.  9. Obsessions. 10. Major depression versus bipolar.  DISCHARGE MEDICATIONS:  1. Plavix 75 mg 1 p.o. q.d.  2. Lithium 450 mg 1/2 tablet b.i.d.  3. Effexor XR 300 mg 1 p.o. q.d.  4. Vasotec 10 mg p.o. q.d.  5. Glucophage 500 mg p.o. q.d.  6. Lasix 40 mg p.o. q.d.  7. Meclozine 25 mg t.i.d.  8. Trental 400 mg t.i.d.  9. Ativan 0.5 mg b.i.d. 10. Seroquel 400 mg q.h.s. 11. Colace 100 mg b.i.d. p.r.n. constipation.  FOLLOW-UP: The patient will be seen by Dr. Cyndia Bent in approximately five to seven days at [facility].  The patient will also have home health PT and OT evaluate home safety.  A home health R.N. will check vital signs, blood work, and lithium level and call this to Dr. Cyndia Bent.  PROCEDURES:  1. Carotid Dopplers showed mild to moderate calcific plaque visualized in     the CCA, ECA, and ICA bilaterally; otherwise no ICA stenosis bilaterally.     Vertebral arteries antegrade bilaterally.  2. Head CT on June 14, 1999 showing no acute disease.  Cerebral atrophy     consistent with aging.  CONSULTATION: None.  HISTORY OF PRESENT ILLNESS: This 77 year old Caucasian female was reported by her daughter to have some slurred speech, confusion, and right-sided facial droop.  She reportedly had no noticeable weakness and was able to ambulate to the car and house without too much distress.  The patient had her usual gait per the daughter.  The family  denied any fevers, chills, diaphoresis, weight loss or gain, chest pain, shortness of breath with radiation.  The patient had been noted to be a little more tremulous than prior.  PHYSICAL EXAMINATION:  VITAL SIGNS: Afebrile at 99.4 degrees.  Heart rate 62, blood pressure 124/50, respiratory rate 20.  Weight 151 pounds.  GENERAL:  She is disheveled and confused.  She had poor insight to her disease and did not know the date or place.  She did have slurred speech with nonsensical words at times.  She had equal leg lengths and visual fields were intact.  She had an equal palate, equal tongue, with no tongue deviation.  LUNGS:  Clear to auscultation bilaterally.  HEART: Regular rate and rhythm.  No carotid bruits noted.  EXTREMITIES: No clubbing, cyanosis, or edema.  Normal capillary refill.  The patient was admitted for acute confusion and delirium.  Of note the patient was recently started on Lithium approximately five to seven days ago.  HOSPITAL COURSE: #1 - NEUROLOGIC: On day two of hospitalization the patient was noted to have normal level of strength in upper and lower extremities.  The patient was less confused and did not have any facial droop, no tongue deviation; however, she did  have some stereotypic tongue movements.  The patients CT was normal.  The patients carotid Dopplers were normal.  We did have a discussion of earlier evaluation and MRI versus MRA with this quick a resolution of her symptoms that may have been TIA.  However, the patient did have a high lithium level.  #2 - LITHIUM TOXICITY:  The patients lithium level was 1.47 with hospital normal range upper level being 1.4.  Lithium has been known to interact with diuretics, ACE inhibitors, by having its level increased as well as dehydration causes this to increase.  The patient was noted to be tremulous and had increased DTRs bilaterally, 3+ upper and lower extremities.  Attempts to reach the patients  psychiatrist were unsuccessful.  In speaking with the hospital pharmacy it was decided to back off on Lithium dosing and recheck in five days.  She will now get 225 mg b.i.d.  DISCHARGE CONDITION: The patient is doing well and reports she is back to her baseline, and does not want to stay in the hospital any longer.  She is working with PT and OT, complying well with them, non-oxygen requiring, not requiring too much assistance with ADLs, and deemed an appropriate candidate to return home.  It may be possible focal neurologic deficits initially have resolved and she will be continued on Plavix.  She will also continue taking Lithium but at a lower dose.  She does have a history of intrusive thoughts and needs to be followed up with her psychiatrist.  The patient did not appear overtly manic at this point.   #3 - TRANSIENT ISCHEMIC ATTACK: This is a distinct possibility.  The patient does not tolerate aspirin well and has not taken it routinely as this upsets her stomach.  She will be started on Plavix as an outpatient.  We did consider possibly ordering a TEE to evaluate for any valvular vegetations or calcific plaques.  #4 - BACK PAIN: The patient has been known to have some back pain over the past several weeks.  X-rays taken last week noted some decreased joint space of L4-L5, L5-S1.  No compression fractures.  She may benefit from better posture and outpatient physical therapy.  #5 - SWALLOWING THERAPY WITH SPEECH PATHOLOGY: Evaluation was performed and the patient did have some difficulty with word finding.  She does perseverate. She was able to swallow without any distress or noted aspiration.  #6 - DIABETES MELLITUS: The patients blood sugars were well controlled while hospitalized.  Will continue outpatient medications.  She was controlled on a sliding-scale while hospitalized and tolerated this well.  Her Glucophage was held in lieu of possible dye study.  Creatinine and BUN  were within normal range.  DISPOSITION: The patient will be discharged tonight to home with appropriate  follow-up with Dr. Cyndia Bent in one week. DD:  06/15/99 TD:  06/15/99 Job: [id number]

## 2010-07-30 NOTE — Assessment & Plan Note (Signed)
[facility]                            CARDIOLOGY OFFICE NOTE   [name], [name]                       MRN:          [id number]  DATE:05/31/2006                            DOB:          July 07, 1923    [name] returns today for followup.  She continues to be wheelchair bound.  It has never been clear to me whether she had furtive dyskinesia or some  other neurological problem.  Unfortunately, she is not ambulatory.  She  had a CABG back in 1996.  I have not seen fit to do any followup Myoview  studies since she has not had any significant angina and she is  wheelchair bound.   The patient has not had any significant chest pain.   She does have a pacemaker in for sick sinus syndrome.  Most of the time  I see her, she is pacing at a lower rate limit.  We will have Gunnar Fusi come  by to check it.   The patient is on aspirin and low-dose propranolol for her tremor and  for her coronary artery disease.   REVIEW OF SYSTEMS:  Remarkable for some lower extremity edema, but no  syncope.  No PND or orthopnea.   1. She is on omeprazole 20 b.i.d.  2. Pentoxifylline 400 t.i.d.  3. An aspirin a day.  4. Metformin 500 a day.  5. Propranolol 10 t.i.d.  6. Sertraline 100 mg a day.  7. Aricept 10 mg a day.  8. Enalapril 5 mg b.i.d.  9. Lasix 40 a day.  10.Synthroid 25 mcg a day.  11.Wellbutrin 150 mg a day.  12.Potassium 20 a day.   EXAM:  She has upper extremity tremors.  Blood pressure is 120/70, pulse 60 with pacing.  HEENT:  Otherwise unremarkable.  Carotids are normal without bruits.  LUNGS:  Clear.  There is an S1, S2 with normal heart sounds.  ABDOMEN:  Benign.  LOWER EXTREMITIES:  Intact pulses without edema.   IMPRESSION:  Stable coronary artery disease.  Coronary artery bypass  grafting in 1996.  Continue current medical therapy.  Continue aspirin  and beta blocker.  No indication for Myoview given the fact that she is  asymptomatic and  wheelchair bound.  The patient is having her pacemaker  interrogated at this time.  Hopefully, we will continue to work with her  family in terms of the telephone transmissions on a quarterly basis.  She will continue her current dose of Lasix and potassium for lower  extremity edema.   Overall, [name]'s primary limitations would appear to be neurological.   She will follow up with her psychiatrist and neurologist as needed.   I will see her back in 6 months.     Noralyn Pick. Eden Emms, MD, [facility]  Electronically Signed    PCN/MedQ  DD: 05/31/2006  DT: 05/31/2006  Job #: [id number]

## 2010-08-16 ENCOUNTER — Other Ambulatory Visit: Payer: Self-pay

## 2010-08-16 ENCOUNTER — Ambulatory Visit (INDEPENDENT_AMBULATORY_CARE_PROVIDER_SITE_OTHER): Payer: Medicare Other | Admitting: *Deleted

## 2010-08-16 DIAGNOSIS — I442 Atrioventricular block, complete: Secondary | ICD-10-CM

## 2010-08-18 NOTE — Progress Notes (Signed)
PACER REMOTE 

## 2010-09-02 ENCOUNTER — Other Ambulatory Visit: Payer: Self-pay | Admitting: Family Medicine

## 2010-09-02 NOTE — Telephone Encounter (Signed)
Refill request

## 2010-09-12 ENCOUNTER — Encounter: Payer: Self-pay | Admitting: *Deleted

## 2010-09-21 ENCOUNTER — Other Ambulatory Visit: Payer: Self-pay | Admitting: Family Medicine

## 2010-09-21 NOTE — Telephone Encounter (Signed)
Refill request

## 2010-09-24 ENCOUNTER — Other Ambulatory Visit: Payer: Self-pay | Admitting: Family Medicine

## 2010-09-24 NOTE — Telephone Encounter (Signed)
Refill request

## 2010-10-04 ENCOUNTER — Other Ambulatory Visit: Payer: Self-pay | Admitting: Family Medicine

## 2010-10-04 NOTE — Telephone Encounter (Signed)
Refill request

## 2010-11-01 ENCOUNTER — Other Ambulatory Visit: Payer: Self-pay | Admitting: *Deleted

## 2010-11-01 MED ORDER — PENTOXIFYLLINE 400 MG PO TBCR: 400.0000 mg | EXTENDED_RELEASE_TABLET | Freq: Three times a day (TID) | ORAL | Status: DC

## 2010-11-03 ENCOUNTER — Telehealth: Payer: Self-pay | Admitting: *Deleted

## 2010-11-03 NOTE — Telephone Encounter (Addendum)
So I called and spoke with pt's daughter and told her that I talked to Dara at [facility] to clarify what if anything the office needed from [place] for her to be seen. I was told that the pt has an appt tomorrow 8.23.2012 @ 115 pm. I informed her Graciella Belton) that her visit did not require a prior auth, also she would like to go to [facility] (pt has been seen there before) and will need a referral. I informed her that I would forward this to Dr. Michaelle Copas nurses.Laureen Ochs, Viann Shove

## 2010-11-04 NOTE — Telephone Encounter (Signed)
If she has been seen by [facility] derm before she does not need a referral, otherwise I would need to see her I guess because I do not know what I am referring her for.

## 2010-11-04 NOTE — Telephone Encounter (Signed)
I told her daughter this and informed her that when she made the appt that should she need a referral to call us and make an appt so that the referral can be made.Laureen Ochs, Viann Shove

## 2010-12-16 ENCOUNTER — Ambulatory Visit (INDEPENDENT_AMBULATORY_CARE_PROVIDER_SITE_OTHER): Payer: Medicare Other | Admitting: Family Medicine

## 2010-12-16 ENCOUNTER — Encounter (HOSPITAL_COMMUNITY): Payer: Self-pay | Admitting: Radiology

## 2010-12-16 ENCOUNTER — Inpatient Hospital Stay (HOSPITAL_COMMUNITY)
Admission: AD | Admit: 2010-12-16 | Discharge: 2010-12-23 | DRG: 240 | Disposition: A | Discharge status: Home Health | Payer: Medicare Other | Source: Ambulatory Visit | Attending: Family Medicine | Admitting: Family Medicine

## 2010-12-16 ENCOUNTER — Inpatient Hospital Stay (HOSPITAL_COMMUNITY): Payer: Medicare Other

## 2010-12-16 ENCOUNTER — Encounter: Payer: Self-pay | Admitting: Family Medicine

## 2010-12-16 VITALS — BP 139/71 | HR 76 | Temp 97.9°F

## 2010-12-16 DIAGNOSIS — F319 Bipolar disorder, unspecified: Secondary | ICD-10-CM | POA: Diagnosis present

## 2010-12-16 DIAGNOSIS — Z950 Presence of cardiac pacemaker: Secondary | ICD-10-CM

## 2010-12-16 DIAGNOSIS — M9080 Osteopathy in diseases classified elsewhere, unspecified site: Secondary | ICD-10-CM | POA: Diagnosis present

## 2010-12-16 DIAGNOSIS — I739 Peripheral vascular disease, unspecified: Secondary | ICD-10-CM

## 2010-12-16 DIAGNOSIS — I70269 Atherosclerosis of native arteries of extremities with gangrene, unspecified extremity: Secondary | ICD-10-CM

## 2010-12-16 DIAGNOSIS — I509 Heart failure, unspecified: Secondary | ICD-10-CM | POA: Diagnosis present

## 2010-12-16 DIAGNOSIS — L98499 Non-pressure chronic ulcer of skin of other sites with unspecified severity: Secondary | ICD-10-CM

## 2010-12-16 DIAGNOSIS — E1169 Type 2 diabetes mellitus with other specified complication: Secondary | ICD-10-CM | POA: Diagnosis present

## 2010-12-16 DIAGNOSIS — F411 Generalized anxiety disorder: Secondary | ICD-10-CM | POA: Diagnosis present

## 2010-12-16 DIAGNOSIS — F0390 Unspecified dementia without behavioral disturbance: Secondary | ICD-10-CM

## 2010-12-16 DIAGNOSIS — I252 Old myocardial infarction: Secondary | ICD-10-CM

## 2010-12-16 DIAGNOSIS — Z87891 Personal history of nicotine dependence: Secondary | ICD-10-CM

## 2010-12-16 DIAGNOSIS — D649 Anemia, unspecified: Secondary | ICD-10-CM | POA: Diagnosis present

## 2010-12-16 DIAGNOSIS — E785 Hyperlipidemia, unspecified: Secondary | ICD-10-CM | POA: Diagnosis present

## 2010-12-16 DIAGNOSIS — K1321 Leukoplakia of oral mucosa, including tongue: Secondary | ICD-10-CM | POA: Diagnosis present

## 2010-12-16 DIAGNOSIS — L97509 Non-pressure chronic ulcer of other part of unspecified foot with unspecified severity: Secondary | ICD-10-CM

## 2010-12-16 DIAGNOSIS — L03119 Cellulitis of unspecified part of limb: Secondary | ICD-10-CM | POA: Insufficient documentation

## 2010-12-16 DIAGNOSIS — I10 Essential (primary) hypertension: Secondary | ICD-10-CM | POA: Diagnosis present

## 2010-12-16 DIAGNOSIS — I2510 Atherosclerotic heart disease of native coronary artery without angina pectoris: Secondary | ICD-10-CM | POA: Diagnosis present

## 2010-12-16 DIAGNOSIS — E1159 Type 2 diabetes mellitus with other circulatory complications: Principal | ICD-10-CM | POA: Diagnosis present

## 2010-12-16 DIAGNOSIS — I059 Rheumatic mitral valve disease, unspecified: Secondary | ICD-10-CM | POA: Diagnosis present

## 2010-12-16 DIAGNOSIS — M869 Osteomyelitis, unspecified: Secondary | ICD-10-CM | POA: Diagnosis present

## 2010-12-16 DIAGNOSIS — Z853 Personal history of malignant neoplasm of breast: Secondary | ICD-10-CM

## 2010-12-16 DIAGNOSIS — E039 Hypothyroidism, unspecified: Secondary | ICD-10-CM | POA: Diagnosis present

## 2010-12-16 DIAGNOSIS — I96 Gangrene, not elsewhere classified: Secondary | ICD-10-CM | POA: Diagnosis present

## 2010-12-16 DIAGNOSIS — Z8673 Personal history of transient ischemic attack (TIA), and cerebral infarction without residual deficits: Secondary | ICD-10-CM

## 2010-12-16 DIAGNOSIS — L0390 Cellulitis, unspecified: Secondary | ICD-10-CM

## 2010-12-16 DIAGNOSIS — L02619 Cutaneous abscess of unspecified foot: Secondary | ICD-10-CM

## 2010-12-16 DIAGNOSIS — Z951 Presence of aortocoronary bypass graft: Secondary | ICD-10-CM

## 2010-12-16 DIAGNOSIS — K219 Gastro-esophageal reflux disease without esophagitis: Secondary | ICD-10-CM | POA: Diagnosis present

## 2010-12-16 LAB — CBC
MCH: 33.5 pg (ref 26.0–34.0)
MCV: 101.8 fL — ABNORMAL HIGH (ref 78.0–100.0)
Platelets: 428 10*3/uL — ABNORMAL HIGH (ref 150–400)
RDW: 14.4 % (ref 11.5–15.5)

## 2010-12-16 LAB — SEDIMENTATION RATE: Sed Rate: 116 mm/hr — ABNORMAL HIGH (ref 0–22)

## 2010-12-16 LAB — DIFFERENTIAL
Eosinophils Absolute: 0.1 10*3/uL (ref 0.0–0.7)
Eosinophils Relative: 1 % (ref 0–5)
Lymphs Abs: 2.2 10*3/uL (ref 0.7–4.0)
Monocytes Relative: 9 % (ref 3–12)

## 2010-12-16 LAB — TSH: TSH: 1.95 u[IU]/mL (ref 0.350–4.500)

## 2010-12-16 LAB — COMPREHENSIVE METABOLIC PANEL
AST: 14 U/L (ref 0–37)
Albumin: 2.2 g/dL — ABNORMAL LOW (ref 3.5–5.2)
BUN: 15 mg/dL (ref 6–23)
CO2: 25 mEq/L (ref 19–32)
Calcium: 9.1 mg/dL (ref 8.4–10.5)
Creatinine, Ser: 0.78 mg/dL (ref 0.50–1.10)
GFR calc non Af Amer: 74 mL/min — ABNORMAL LOW (ref >90)

## 2010-12-16 LAB — HEMOGLOBIN A1C: Mean Plasma Glucose: 114 mg/dL (ref <117)

## 2010-12-16 MED ORDER — IOHEXOL 300 MG/ML  SOLN
100.0000 mL | Freq: Once | INTRAMUSCULAR | Status: AC | PRN
  Administered 2010-12-16: 100 mL via INTRAVENOUS

## 2010-12-16 NOTE — Assessment & Plan Note (Signed)
Please see body of note

## 2010-12-16 NOTE — Progress Notes (Signed)
Family Medicine Teaching [facility] Admission History and Physical  Patient name: [name] Medical record number: [id number] Date of birth: March 20, 1923 Age: 77 y.o. Gender: female  Primary Care Provider: Antoine Primas, DO, DO  Chief Complaint: right foot pain History of Present Illness: [name] is a 77 y.o. year old female presenting with past medical history that is significant for hypertension hypothyroidism coronary artery disease status post artery bypass graft in congestive heart failure and bipolar disease who presents with a four-day history of right foot pain. Patient states this headaches on her wheelchair as well as when she sleeps she scratched it at one point. [facility] who did an x-ray stated that there is no signs of fractures sent home patient also [facility] and told to continue Betadine and alcohol washes 3 times a day. Patient as well as patient's daughter has been doing this and has not noticed any improvement in his foot pain. Actually states that it seems to be getting worse and now there is some discharge coming from different places on the foot it is very red and swollen. Patient is not on any antibiotics at this time and denies any type of fevers, chills, nausea, vomiting, abdominal pain, shortness of breath, or chest pain.  Patient Active Problem List  Diagnoses  . HYPOTHYROIDISM, UNSPECIFIED  . HYPERLIPIDEMIA  . ANEMIA, MACROCYTIC  . DEMENTIA, MULTI-INFARCT  . DEPRESSION, MAJOR, RECURRENT  . BIPOLAR DISORDER  . ANXIETY  . HYPERTENSION, MILD  . CAD, ARTERY BYPASS GRAFT  . AV BLOCK, COMPLETE  . CHF  . TIA  . CLAUDICATION, INTERMITTENT  . HEMORRHOIDS, NOS  . GERD  . OVARIAN CYST  . TREMOR   Past Medical History: Past Medical History  Diagnosis Date  . Allergy   . Cancer     Breast and skin  . CHF (congestive heart failure)   . Depression   . GERD (gastroesophageal  reflux disease)   . Heart murmur   . Hyperlipidemia   . Hypertension   . Myocardial infarction   . Thyroid disease     Past Surgical History: Past Surgical History  Procedure Date  . Breast surgery   . Coronary artery bypass graft      Social History: History   Social History  . Marital Status: Widowed    Spouse Name: N/A    Number of Children: N/A  . Years of Education: N/A   Social History Main Topics  . Smoking status: Former Smoker    Quit date: 06/22/1995  . Smokeless tobacco: Never Used  . Alcohol Use: No  . Drug Use: No  . Sexually Active: No   Other Topics Concern  . None   Social History Narrative  . None    Family History: No family history on file.  Allergies: Allergies  Allergen Reactions  . Codeine   . Latex   . Morphine     Current Outpatient Prescriptions  Medication Sig Dispense Refill  . ARIPiprazole (ABILIFY) 15 MG tablet Take 15 mg by mouth daily.        Marland Kitchen aspirin 81 MG EC tablet Take 81 mg by mouth daily.        Marland Kitchen Bioflavonoid Products (ESTER-C) 500-550 MG TABS Take 1 tablet by mouth daily.        Marland Kitchen buPROPion (WELLBUTRIN XL) 150 MG 24 hr tablet Take 3 tablets by mouth every morning       .  donepezil (ARICEPT) 10 MG tablet TAKE 1 TABLET BY MOUTH DAILY AT BEDTIME  32 tablet  1  . enalapril (VASOTEC) 5 MG tablet TAKE 1 TABLET BY MOUTH 2 TIMES A DAY  64 tablet  6  . furosemide (LASIX) 40 MG tablet TAKE 1/2 TABLET BY MOUTH DAILY  45 tablet  3  . Glucerna (GLUCERNA) LIQD 1 to 3 cans by mouth daily for nutritional supplement       . KLOR-CON M20 20 MEQ tablet TAKE 1 TABLET DAILY  45 tablet  3  . levothyroxine (SYNTHROID, LEVOTHROID) 25 MCG tablet TAKE 1 TABLET BY MOUTH DAILY  31 tablet  5  . LORazepam (ATIVAN) 0.5 MG tablet Take 0.5 mg by mouth 2 (two) times daily.        . metFORMIN (GLUCOPHAGE) 500 MG tablet TAKE 1 TABLET EVERY DAY  31 tablet  4  . mometasone (NASONEX) 50 MCG/ACT nasal spray 2 sprays by Nasal route as needed.        .  Multiple Vitamin (MULTIVITAMIN) tablet Take 1 tablet by mouth daily.        . nitroGLYCERIN (NITROSTAT) 0.4 MG SL tablet Place 0.4 mg under the tongue every 5 (five) minutes as needed. For chest pain       . omeprazole (PRILOSEC) 20 MG capsule Take 20 mg by mouth 2 (two) times daily.        . pentoxifylline (TRENTAL) 400 MG CR tablet Take 1 tablet (400 mg total) by mouth 3 (three) times daily with meals.  90 tablet  6  . polyethylene glycol (MIRALAX) powder Take 17 g by mouth daily as needed.        Marland Kitchen PRILOSEC OTC 20 MG tablet 1 TAB BY MOUTH TWICE DAILY  60 tablet  10  . propranolol (INDERAL) 10 MG tablet TAKE 1 TABLET BY MOUTH 3 TIMES A DAY  90 tablet  3  . sertraline (ZOLOFT) 100 MG tablet Take 100 mg by mouth every morning.        . simvastatin (ZOCOR) 40 MG tablet TAKE 1 TABLET ONCE A DAY.  31 tablet  3  . trihexyphenidyl (ARTANE) 2 MG tablet Take 2 mg by mouth daily.         Review Of Systems: Per HPI with the following additions:  Otherwise 12 point review of systems was performed and was unremarkable.  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 Patient's mucous membranes are moist uvula midline no open lesions. Heart: 2/6 SEM is heard at upper left sternal border, regular rate and rhythm, S3 present Lungs: clear to auscultation, no wheezes or rales and unlabored breathing Abdomen: abdomen is soft without significant tenderness, masses, organomegaly or guarding Extremities: The patient's right foot has significant amount of swelling in erythema over mostly the dorsal tibial surfaces. Stops at ankle. Patient is neurovascularly intact and has 1+ dorsalis pedis pulse patient does have 3 different areas of sinus tunneling to the skin surface one on the lateral aspect of the foot and 2 on the medial aspect of the  foot the largest being over the big toe going from the tip of the big toe proximally to the beginning of the hindfoot. Patient does have some pus that is being expressed with palpation no blood seen. Patient has what could be  considered an eschar over the plantar surface over the first ray. Skin:as stated Neurology: normal without focal findings, reflexes normal and symmetric and Patient does have some dementia at baseline.  Labs and Imaging: Lab Results  Component Value Date/Time   NA 141 06/22/2010  3:25 PM   K 4.6 06/22/2010  3:25 PM   CL 104 06/22/2010  3:25 PM   CO2 24 06/22/2010  3:25 PM   BUN 18 06/22/2010  3:25 PM   CREATININE 0.95 06/22/2010  3:25 PM   CREATININE 1.06 11/18/2009  8:50 PM   GLUCOSE 110* 06/22/2010  3:25 PM   Lab Results  Component Value Date   WBC 7.8 06/22/2010   HGB 13.7 06/22/2010   HCT 41.7 06/22/2010   MCV 101.2* 06/22/2010   PLT 198 06/22/2010      Assessment and Plan: [name] is a 77 y.o. year old female presenting with right foot cellulitis with a questionable osteomyelitis. 1. cellulitis: Patient gently has a cellulitis and more likely an osteomyelitis of his right foot. Do not fully comprehends what its podiatry was trying as an outpatient center at this point will call over at [facility] to get more information. We will have patient cover for MRSA at this time with vancomycin and then cover for gram negatives as well as anaerobes coverage with ciprofloxacin and Flagyl. We will get a Ortho or vascular consults once the CT scan is accomplished.  Patient has no systemic signs of systems will not any blood cultures and we will get some labs for her ESR as well as CRP complete blood count. 2.  mouth sore. This likely is a leukoplakia could be concerned for some type of cancer likely could get a biopsy well at the inpatient team decide if this is necessary. 3.  hypertension: At this time will continue her current medications. 4. anxiety: We'll continue  the Ativan as necessary and monitor 5. Hyperglycemia: Patient has been hyperglycemic due to her Abilify use we will get an A1c at this time to make sure patient is not diabetic. 6.  coronary artery disease: Continue her current medications at this time no signs of congestive heart failure patient though does have claudication and we will get a arterial brachial index consider doing arterial Dopplers in the near future as well. 7. Dementia- patient is on Aricept we will continue at this time 8. hypothyroidism continue patient's Synthroid 9. FEN/GI: KVO IV fluids we will make patient n.p.o. for now in case patient is new to the operating room 3. Prophylaxis: We'll do heparin as well as Protonix 4. Disposition: Pending further workup

## 2010-12-17 LAB — BASIC METABOLIC PANEL
CO2: 25 mEq/L (ref 19–32)
Chloride: 107 mEq/L (ref 96–112)
Sodium: 140 mEq/L (ref 135–145)

## 2010-12-17 LAB — CBC
Platelets: 372 10*3/uL (ref 150–400)
RBC: 3.16 MIL/uL — ABNORMAL LOW (ref 3.87–5.11)
WBC: 10 10*3/uL (ref 4.0–10.5)

## 2010-12-17 LAB — C-REACTIVE PROTEIN: CRP: 11.69 mg/dL — ABNORMAL HIGH (ref <0.60)

## 2010-12-18 LAB — BASIC METABOLIC PANEL
CO2: 24 mEq/L (ref 19–32)
GFR calc non Af Amer: 75 mL/min — ABNORMAL LOW (ref >90)
Glucose, Bld: 111 mg/dL — ABNORMAL HIGH (ref 70–99)
Potassium: 3.7 mEq/L (ref 3.5–5.1)
Sodium: 141 mEq/L (ref 135–145)

## 2010-12-18 NOTE — Consult Note (Signed)
[name], [name]                ACCOUNT NO.:  [id number]  MEDICAL RECORD NO.:  [id number]  LOCATION:  4508                         FACILITY:  [facility]  PHYSICIAN:  Di Kindle. Edilia Bo, M.D.DATE OF BIRTH:  January 05, 1924  DATE OF CONSULTATION: DATE OF DISCHARGE:                                CONSULTATION   REASON FOR CONSULTATION:  Second opinion concerning need for right above- the-knee amputation.  Consult is from Dr. Imogene Burn.  HISTORY:  This is an 77 year old woman who was admitted with a wound on her right foot.  She was seen in consultation by Dr. Imogene Burn and given the extent of her wound.  It was felt that the foot would not be salvageable even with aggressive revascularization and debridement.  Dr. Imogene Burn had recommended right above-the-knee amputation and the family requested a second opinion.  I have reviewed the patient's records.  In summary, this wound apparently began back in August.  Of note, the patient is nonambulatory and staged for the most part in her wheelchair.  She is able to stand with the two people assisting.  She was admitted with some cellulitis and an extensive wound on her right foot.  She has been on intravenous antibiotics.  She is nonambulatory.  There is no history of claudication and she denies rest pain.  She denies having had fever or chills.  PAST MEDICAL HISTORY:  Fairly extensive.  She does have a history of coronary artery disease and has had previous coronary revascularization. In addition, she has a history of complete heart block and has had a pacemaker placed.  She has a history also of hypothyroidism, hypertension, hyperlipidemia, dementia, bipolar disorder, and congestive heart failure.  SOCIAL HISTORY:  She quit tobacco in 1997.  FAMILY HISTORY:  She is unaware of any history of premature cardiovascular disease.  REVIEW OF SYSTEMS:  CARDIOVASCULAR:  She had no chest pain, chest pressure, palpitations, or arrhythmias.  PULMONARY:  She  has had no productive cough, bronchitis, asthma, or wheezing.  PHYSICAL EXAMINATION:  GENERAL:  This is a pleasant 77 year old woman who appears her stated age. VITAL SIGNS:  Temperature is 98, heart rate is 80, blood pressure 128/60, saturation 89%. HEENT:  Unremarkable. CARDIAC:  She has a regular rate and rhythm with a systolic murmur. LUNGS:  Clear bilaterally to auscultation. EXTREMITIES:  She has a palpable right femoral pulse.  I am unable to palpate a popliteal or pedal pulses in the right foot.  She does have a dorsalis pedis pulse on the left.  She has extensive full-thickness wound on the medial aspect of the right foot with surrounding erythema and necrotic tissue.  There is also a wound on the dorsum of the foot with necrotic tissue adjacent to the tendons.  I have reviewed her CT scan of the foot, which shows severe diffuse osteopenia with no osteomyelitis or cortical erosion to suggest osteomyelitis.  She has evidence of cellulitis.  Her lab studies show a white count 10.  IMPRESSION:  This patient presents with an extensive full-thickness wound on the right foot.  I have explained that even if she were to undergo workup for revascularization and was a candidate for revascularization and  had a successful bypass, even with a pulsatile flow to the right foot.  Given the extent of the wound, I do not think the foot is salvageable.  I think she would also be at very high risk for surgery given her age and multiple comorbidities.  I have explained that I think that the really the only option is primary above-the-knee amputation.  She is nonambulatory and she has angulation of the knee, which would prohibit her from having a below-the-knee amputation in addition.  I have reviewed this with the patient and her family member who is present with her and she seems to understand.     Di Kindle. Edilia Bo, M.D.     CSD/MEDQ  D:  12/17/2010  T:  12/18/2010  Job:   [id number]  Electronically Signed by Waverly Ferrari M.D. on 12/18/2010 04:40:42 PM

## 2010-12-19 LAB — CBC
Hemoglobin: 10.4 g/dL — ABNORMAL LOW (ref 12.0–15.0)
MCH: 33 pg (ref 26.0–34.0)
MCV: 101.9 fL — ABNORMAL HIGH (ref 78.0–100.0)
RBC: 3.15 MIL/uL — ABNORMAL LOW (ref 3.87–5.11)
WBC: 9.2 10*3/uL (ref 4.0–10.5)

## 2010-12-19 LAB — BASIC METABOLIC PANEL
CO2: 28 mEq/L (ref 19–32)
Calcium: 8.8 mg/dL (ref 8.4–10.5)
Chloride: 108 mEq/L (ref 96–112)
Glucose, Bld: 117 mg/dL — ABNORMAL HIGH (ref 70–99)
Sodium: 143 mEq/L (ref 135–145)

## 2010-12-20 ENCOUNTER — Inpatient Hospital Stay (HOSPITAL_COMMUNITY): Payer: Medicare Other

## 2010-12-20 LAB — CBC
HCT: 32.3 % — ABNORMAL LOW (ref 36.0–46.0)
HCT: 32.3 % — ABNORMAL LOW (ref 36.0–46.0)
Hemoglobin: 10.2 g/dL — ABNORMAL LOW (ref 12.0–15.0)
Hemoglobin: 10.4 g/dL — ABNORMAL LOW (ref 12.0–15.0)
MCH: 32.3 pg (ref 26.0–34.0)
MCH: 33.1 pg (ref 26.0–34.0)
MCHC: 32.2 g/dL (ref 30.0–36.0)
MCV: 102.2 fL — ABNORMAL HIGH (ref 78.0–100.0)
MCV: 102.9 fL — ABNORMAL HIGH (ref 78.0–100.0)
Platelets: 328 10*3/uL (ref 150–400)
RBC: 3.16 MIL/uL — ABNORMAL LOW (ref 3.87–5.11)
RBC: 3.14 MIL/uL — ABNORMAL LOW (ref 3.87–5.11)

## 2010-12-20 LAB — BASIC METABOLIC PANEL
BUN: 6 mg/dL (ref 6–23)
CO2: 27 mEq/L (ref 19–32)
Calcium: 8.5 mg/dL (ref 8.4–10.5)
Chloride: 107 mEq/L (ref 96–112)
Creatinine, Ser: 0.85 mg/dL (ref 0.50–1.10)
Glucose, Bld: 101 mg/dL — ABNORMAL HIGH (ref 70–99)

## 2010-12-20 LAB — ABO/RH: ABO/RH(D): O POS

## 2010-12-20 LAB — TYPE AND SCREEN

## 2010-12-21 ENCOUNTER — Encounter: Payer: Self-pay | Admitting: Internal Medicine

## 2010-12-21 ENCOUNTER — Other Ambulatory Visit: Payer: Self-pay | Admitting: Vascular Surgery

## 2010-12-21 HISTORY — PX: ABOVE KNEE LEG AMPUTATION: SUR20

## 2010-12-21 LAB — BASIC METABOLIC PANEL
CO2: 26 mEq/L (ref 19–32)
Chloride: 106 mEq/L (ref 96–112)
Creatinine, Ser: 0.76 mg/dL (ref 0.50–1.10)
GFR calc Af Amer: 87 mL/min — ABNORMAL LOW (ref >90)
Sodium: 142 mEq/L (ref 135–145)

## 2010-12-21 LAB — GLUCOSE, CAPILLARY
Glucose-Capillary: 138 mg/dL — ABNORMAL HIGH (ref 70–99)
Glucose-Capillary: 136 mg/dL — ABNORMAL HIGH (ref 70–99)

## 2010-12-21 NOTE — Consult Note (Signed)
[name], [name]                ACCOUNT NO.:  [id number]  MEDICAL RECORD NO.:  [id number]  LOCATION:  4508                         FACILITY:  [facility]  PHYSICIAN:  Fransisco Hertz, MD       DATE OF BIRTH:  Jun 30, 1923  DATE OF CONSULTATION: DATE OF DISCHARGE:                                CONSULTATION   REASON FOR CONSULTATION:  Nonhealing wound of the right foot.  HISTORY OF PRESENT ILLNESS:  The patient is an 77 year old female with past medical history significant for coronary artery disease status post CABG, hypertension, hypothyroidism, and complete heart block status post pacemaker placement who we were asked to evaluate her nonhealing right foot wound.  The patient's daughter states that she first noted a small wound on her mother's right foot back in August at which time they presented to [facility].  She states that since that time minimal intervention or wound care has been done and her wound has continued to progress as her mother continues to bump it or hit it on objects at home.  The patient is not ambulatory and uses a wheelchair, although she is able to stand with two person assist.  The patient presented to her primary care physician today, Dr. Katrinka Blazing for evaluation of her right foot which the daughter states has now become "red." After evaluation by Dr. Katrinka Blazing, he felt that the patient had developed cellulitis with possible osteomyelitis and therefore admitted her for extensive evaluation and IV antibiotic therapy.  Vascular Surgery consult was obtained regarding wound care and vascular disease.  The patient's daughter states that she had some sort of vascular intervention over 15 years ago by Dr. Arbie Cookey, although no records are available for this and the patient nor her daughter know what the procedure was.  The patient does have a old well-healed incision in the right groin.  She and her daughter state that prior to this right foot wound, she has had no issues  with rest pain or nonhealing wounds in the past.  When she was ambulatory, she did have intermittent claudication symptoms in the right lower extremity.  The patient's daughter states that she has noticed the foot wound progressively getting worse over the past several weeks.  She just noticed redness today.  The patient is on  antibiotic therapy and her daughter denies symptoms of fevers, chills, nausea, vomiting, shortness of breath, chest pain and diarrhea/ constipation.  The patient was admitted today by the [facility] and started on vancomycin, ciprofloxacin, and Flagyl IV.  ABIs are currently pending as well as a right foot x-ray.  A CT scan of the leg is also pending.  PAST MEDICAL HISTORY: 1. Hypothyroidism. 2. Hyperlipidemia. 3. Microcytic anemia. 4. Dementia. 5. Depression. 6. Bipolar disorder. 7. Anxiety. 8. Hypertension. 9. Coronary artery disease. 10.Complete heart block. 11.CHF. 12.TIA. 13.Hemorrhoids. 14.GERD. 15.Ovarian cyst. 16.Tremor. 17.Breast cancer. 18.Skin cancer. 19.History of MI. 20.Diabetes mellitus. 21.History of syncope.  PAST SURGICAL HISTORY: 1. CABG. 2. Bilateral mastectomies. 3. Dual-chamber pacemaker placement. 4. Some vascular procedure performed by Dr. Arbie Cookey, many years ago that     the patient nor her daughter are able to describe. 5.  Cholecystectomy. 6. Amputation of third through fourth toes on the right foot.  ALLERGIES: 1. CODEINE, hallucinations. 2. MORPHINE, hallucinations. 3. LATEX, skin rash.  MEDICATIONS:  Please refer to this patient's admitting [facility] for this extensive list.  SOCIAL HISTORY:  The patient is widowed with one daughter.  She is a former smoker and quit in 1997.  She denies alcohol use.  FAMILY HISTORY:  Coronary artery disease, diabetes mellitus.  REVIEW OF SYSTEMS:  The patient complains of pain in her right foot, but otherwise her review of systems are negative.  PHYSICAL  EXAMINATION:  VITAL SIGNS:  BP 128/66, heart rate 102, temperature 97.5, O2 saturation 95% on room air. GENERAL:  The patient is a frail elderly female, appearing her stated age.  She is alert and cooperative in no acute distress. HEENT:  Normocephalic, atraumatic.  PERRLA.  EOMI. CARDIAC:  Regular rate and rhythm with a 2/6 murmur noted. LUNGS:  Clear to auscultation and unlabored. ABDOMEN:  Soft, nontender, nondistended with active bowel sounds. MUSCULOSKELETAL:  There are 2+ radial pulses present bilaterally.  There are 2+ femoral pulses present bilaterally.  There is a 2+ left DP palpable.  There are no popliteal or pedal pulses palpable in the right lower extremity.  There is a Doppler signal in the right PT.  There is extensive wound present on the right foot from the medial aspect of the right great toe that extends to the level of the heel on the medial aspect of the right foot.  There is surrounding erythema as well as extensive exudate present in this wound.  There is also extensive eschar present.  The wound measures 10 x 5 cm.  There is also a 3 x 3 x 1 cm and 0.5 x 1.5 x 1 cm smaller wound present on the right foot.  There is also exudate present in these wounds.  There is moderate serosanguineous type drainage present.  The third through fourth toes were previously amputated on this foot. NEUROLOGIC:  The patient is alert and oriented, although her speech is somewhat slurred.  It is clear.  She is not a reliable historian and her daughter provides majority of her history secondary to her dementia. Motor and sensation are intact in bilateral upper extremities.  Strength is 4/5 in the bilateral lower extremities.  Sensation is intact. SKIN:  There is no evidence of rashes or other wounds and breakdown other than the previously stated in the musculoskeletal section of this physical exam.  LABS:  CBC and BMP on December 16, 2010, white count 11.2, hemoglobin 11, hematocrit  33.4, platelets 428.  Sodium 139, potassium 4.1, BUN 15, creatinine 0.78.  IMAGING:  A CT scan of her right foot as well as ABIs are currently pending.  ASSESSMENT: 1. Non-salvageable right foot with dry gangrene plus or minus     osteomyelitis. 2. Hypertension. 3. Anxiety. 4. Depression. 5. Hypothyroidism.  PLAN:  Dr. Imogene Burn has evaluated the patient and her right foot is non-salvagable.  As she is non-ambulatory with multiple comorbidities, he recommended a right above-the-knee amputation to the patient and her daughter.  He explained that in this situation, a right below-knee amputation has a possibility of not healing due to no palpable pulses in her pop or pedal areas as well as the fact that her right lower extremity is cold to the touch from the knee distally. Would recommend continuation of IV antibiotics and will obtain ABIs for documentation.  Also recommend painting the right foot with Betadine  daily.  The patient and her daughter would like to consider whether or not to proceed with amputation.  Once the decision has been made, we will schedule the amputation if they decide to proceed.  All the patient's medical issues, will continue to be monitored and followed by the [facility] Team.     Pecola Leisure, PA   ______________________________ Fransisco Hertz, MD    AY/MEDQ  D:  12/16/2010  T:  12/17/2010  Job:  [id number]  Electronically Signed by Pecola Leisure PA on 12/20/2010 01:55:19 PM Electronically Signed by Leonides Sake MD on 12/21/2010 10:05:02 AM

## 2010-12-22 LAB — CBC
Hemoglobin: 10.1 g/dL — ABNORMAL LOW (ref 12.0–15.0)
MCH: 33.4 pg (ref 26.0–34.0)
MCHC: 32.3 g/dL (ref 30.0–36.0)
Platelets: 285 10*3/uL (ref 150–400)
RBC: 3.02 MIL/uL — ABNORMAL LOW (ref 3.87–5.11)

## 2010-12-22 LAB — GLUCOSE, CAPILLARY: Glucose-Capillary: 129 mg/dL — ABNORMAL HIGH (ref 70–99)

## 2010-12-22 LAB — BASIC METABOLIC PANEL
CO2: 25 mEq/L (ref 19–32)
Calcium: 8.7 mg/dL (ref 8.4–10.5)
GFR calc non Af Amer: 76 mL/min — ABNORMAL LOW (ref >90)
Glucose, Bld: 157 mg/dL — ABNORMAL HIGH (ref 70–99)
Potassium: 3.8 mEq/L (ref 3.5–5.1)
Sodium: 140 mEq/L (ref 135–145)

## 2010-12-27 ENCOUNTER — Telehealth: Payer: Self-pay | Admitting: Family Medicine

## 2010-12-27 MED ORDER — NYSTATIN 100000 UNIT/ML MT SUSP: 500000.0000 [IU] | Freq: Four times a day (QID) | OROMUCOSAL | Status: AC

## 2010-12-27 NOTE — Telephone Encounter (Signed)
Dr.  Katrinka Blazing notified and he will send in medication.  Daughter notified. She is currently not on antibiotics  but was given several in the hospital.

## 2010-12-27 NOTE — Telephone Encounter (Signed)
Will text page Dr. Katrinka Blazing to ask him about this

## 2010-12-27 NOTE — Telephone Encounter (Signed)
States she was in hosp and was given several abx.  She now has thrush in her mouth and wants to know if something can be called in since she is not able to get out right now. [facility] [address]

## 2010-12-27 NOTE — Telephone Encounter (Signed)
NEEDS to GET THE ANTIBIOTICS GIVEN TO HER ON DISCHARGE! Will send in medicine for thrush now.

## 2010-12-29 NOTE — Op Note (Signed)
[name], [name]                ACCOUNT NO.:  [id number]  MEDICAL RECORD NO.:  [id number]  LOCATION:  4508                         FACILITY:  [facility]  PHYSICIAN:  Fransisco Hertz, MD       DATE OF BIRTH:  Jul 12, 1923  DATE OF PROCEDURE:  12/21/2010 DATE OF DISCHARGE:                              OPERATIVE REPORT   PROCEDURE:  Right above-the-knee amputation.  PREOPERATIVE DIAGNOSIS:  Right foot gangrene.  POSTOPERATIVE DIAGNOSIS:  Right foot gangrene.  SURGEON:  Fransisco Hertz, MD  ANESTHESIA:  General.  FINDINGS:  Limited bleeding in the right thigh, but viable thigh muscle.  SPECIMEN:  Right above-the-knee amputation specimen which was sent to Pathology.  ESTIMATED BLOOD LOSS:  Minimum.  INDICATIONS:  This is an 77 year old patient with multiple comorbidities who presented with extensive right foot dry gangrene.  Based on amount of tissue loss, I did not feel this foot was salvageable. Additionally, with her multiple comorbidities and her nonambulatory status, it did not make sense to expose her to the risk of a revascularization procedure given the low benefit she would obtain from revascularization as she does not ambulate.  Subsequently, I recommended proceeding with a right above the knee amputation.  The family requested a second opinion.  My partner, Dr. Edilia Bo consulted on this patient and gave them a second opinion which concurred with my previous opinion.  Subsequently, family and the patient have agreed to proceed forward with a right above-the-knee amputation.  The patient is aware of the risks of this procedure including bleeding, infection, possibility of need for additional higher amputation in the future and possible need for additional procedures in the future.  The patient and family are aware of these risks and agreed to proceed forward.  DESCRIPTION OF OPERATION:  After full informed written consent was obtained from the patient, she was brought back  to the operating room and placed supine upon the operating table.  After obtaining adequate anesthesia, she was prepped and draped in a standard fashion for a right above the knee amputation.  Note, prior to prepping this patient, I placed a nonsterile tourniquet on the upper thigh.  I marked out the anterior and posterior flaps for a fishmouth-type above-the-knee amputation, and then I exsanguinated the right leg with an Esmarch bandage and inflated the tourniquet to 300 mmHg.  I then made incisions along the previously marked flaps using electrocautery, dissected through the underlying subcutaneous tissue, fascia, and muscle and then I dissected the femur it in a periosteal fashion until I was about 4 cm proximal to the most distal aspect of the anterior skin flap.  Using a power saw, then I transected the femur at this level, and passed off the above-the-knee amputation as the specimen.  I then rasped the edges of this bone to smooth them out and then I dropped the tourniquet.  At this point, all major vessels I found were suture ligated.  Note that there was some bleeding, but it was not particularly perfuse.  Medially, she had an extreme amount of fat collection which made me a bit concerned about the long-term viability of this portion of the staple line as  there was almost 3-4 cm of fat on the medial aspect of this staple line.  At this point, I irrigated out the stump looking for any more active bleeding. There was no more active bleeding at this point.  Note all of the muscle was viable in this thigh.  I then reapproximated the anteroposterior fascia with interrupted stitches of 2-0 Vicryl along the entirety of this amputation and then I reapproximated some of the medial aspect of this staple line with 3-0 Vicryl to better reapproximate the fatty  subcutaneous tissue at this level.  We then reapproximated the skin with  staples along the entirety of this incision and cleaned off  the skin at this point.  I placed Adaptic and then placed sterile fluffs and then wrapped the entire above-the-knee amputation with a Kerlix after removing the nonsterile tourniquet.  A gentle Ace wrap was applied to help secure this bandage in place.  COMPLICATIONS:  None.  CONDITION:  Stable.     Fransisco Hertz, MD     BLC/MEDQ  D:  12/21/2010  T:  12/21/2010  Job:  [id number]  Electronically Signed by Leonides Sake MD on 12/29/2010 09:57:21 PM

## 2011-01-03 ENCOUNTER — Other Ambulatory Visit: Payer: Self-pay | Admitting: Family Medicine

## 2011-01-04 ENCOUNTER — Ambulatory Visit (INDEPENDENT_AMBULATORY_CARE_PROVIDER_SITE_OTHER): Payer: Medicare Other | Admitting: Family Medicine

## 2011-01-04 ENCOUNTER — Encounter: Payer: Self-pay | Admitting: Family Medicine

## 2011-01-04 ENCOUNTER — Other Ambulatory Visit: Payer: Self-pay | Admitting: Family Medicine

## 2011-01-04 DIAGNOSIS — IMO0002 Reserved for concepts with insufficient information to code with codable children: Secondary | ICD-10-CM

## 2011-01-04 DIAGNOSIS — S78119A Complete traumatic amputation at level between unspecified hip and knee, initial encounter: Secondary | ICD-10-CM

## 2011-01-04 DIAGNOSIS — Z89619 Acquired absence of unspecified leg above knee: Secondary | ICD-10-CM

## 2011-01-04 DIAGNOSIS — F0150 Vascular dementia without behavioral disturbance: Secondary | ICD-10-CM

## 2011-01-04 MED ORDER — ATENOLOL 50 MG PO TABS: 50.0000 mg | ORAL_TABLET | Freq: Every day | ORAL | Status: DC

## 2011-01-04 MED ORDER — LISINOPRIL 10 MG PO TABS: 10.0000 mg | ORAL_TABLET | Freq: Every day | ORAL | Status: DC

## 2011-01-04 MED ORDER — OXYCODONE-ACETAMINOPHEN 2.5-325 MG PO TABS: 1.0000 | ORAL_TABLET | Freq: Three times a day (TID) | ORAL | Status: AC | PRN

## 2011-01-04 NOTE — Assessment & Plan Note (Signed)
Patient is on many medications at this time. Per caregivers though patient has gone to numerous different regimens and had poor results with other medications. At this time will not make any changes but do not feel comfortable adding another medication for her sundowning at this time. If he becomes a problem would have patient come back for evaluation and then we'll consider starting Haldol but if time that would need to decrease other medications such as Abilify and other anti-cholinergic medications

## 2011-01-04 NOTE — Progress Notes (Signed)
  Subjective:    Patient ID: [name], female    DOB: 27-Oct-1923, 77 y.o.   MRN: [id number]  HPI 77 year old female with recent hospitalization for osteomyelitis of the right lower extremity status post above-the-knee amputation. Patient is here with caregiver as well as daughter. Patient has been doing very well has home health RN and home health physical therapy which has helped tremendously per daughter. Patient has been very well controlled with pain only using her pain medications at night. Her caregiver patient has been doing very well acting like herself not too concerned about the loss of the leg.  Patient does have severe Alzheimer's and dementia patient has been acting like herself most of this time. But has been having some sundowning. No real changes in medication other than stated below. Patient's daughter does not want her to stop any of her other medications because when we have changed him in the past patient has become very aggressive and somewhat physical. Patient's daughter though would like to know if there's anything for sundowning.  Discuss patient's condition at length. See if daughter is comfortable with patient at home patient's daughter states that she is and is now with a placement at this time.  Patient's diabetes is a little better control. Leukosis usually in the 150s at this time only on metformin one daily.  Review of Systems     Objective:   Physical Exam Gen. no apparent distress person very pleasant Heart: 2/6 SEM is heard at upper left sternal border, regular rate and rhythm, S3 present Lungs: clear to auscultation, no wheezes or rales and unlabored breathing Abdomen: abdomen is soft without significant tenderness, masses, organomegaly or guarding Right extremity: Patient's incision well-healing no erythema patient still has staples intact. Neurovascularly intact at stump    Assessment & Plan:

## 2011-01-04 NOTE — Assessment & Plan Note (Signed)
Patient appears to be well healing. The patient is written for new wheelchair at this time. Patient also told to have home health RN remove staples in one week time. Forms filled out for home health RN to continue to November 30

## 2011-01-04 NOTE — Patient Instructions (Signed)
Good to see you Ask the nurse to remove the staples next week or 14-21 days post surgical date I am refilling your medication for pain meds I should see you again in 2-3 months.

## 2011-01-04 NOTE — Telephone Encounter (Signed)
Refill request

## 2011-01-06 NOTE — Discharge Summary (Signed)
[name], [name]                ACCOUNT NO.:  [id number]  MEDICAL RECORD NO.:  [id number]  LOCATION:  4508                         FACILITY:  [facility]  PHYSICIAN:  Dr. Deirdre Priest          DATE OF BIRTH:  [birth year]  DATE OF ADMISSION:  12/16/2010 DATE OF DISCHARGE:  12/23/2010                              DISCHARGE SUMMARY   PRIMARY CARE PHYSICIAN:  Antoine Primas, DO  at [facility].  CONSULTANTS:  Fransisco Hertz, MD, Vascular Surgery.  DISCHARGE DIAGNOSES:  Primary; 1. Dry gangrene of the right foot, status post above the knee     amputation. 2. Peripheral arterial disease.  Secondary; 1. Hypertension. 2. Coronary artery disease, status post coronary artery bypass graft.     Ejection fraction of 45% with moderate mitral regurgitation. 3. Hypothyroidism. 4. Anxiety. 5. Depression. 6. Dementia. 7. 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  DISCHARGE MEDICATIONS: 1. Atenolol 50 mg by mouth daily. 2. Vicodin 5/325 one tab by mouth every 6 hours as needed. 3. Lisinopril 10 mg by mouth daily. 4. MiraLAX 17 g by mouth daily as needed for constipation. 5. Vicodin q.6 hours as needed for pain. 6. Abilify 15 mg 1 tab by mouth daily. 7. Aspirin 81 mg 1 tab by mouth every morning. 8. Bupropion extended release 150 mg, take 3 tablets by mouth every     morning. 9. Donepezil 10 mg 1 tab by mouth every morning. 10.Furosemide 40 mg half tab by mouth daily. 11.Klor-Con 20 mEq 1 tab by mouth daily. 12.Levothyroxine 1 tab by mouth every morning. 13.Lorazepam 1 tab by mouth twice daily. 14.Multivitamin over-the-counter 1 tab by mouth daily. 15.Nasonex nasal spray 1 spray nasally daily as needed. 16.Prilosec 20 mg 1 tab by mouth twice daily. 17.Sertraline 100 mg 1 tab by mouth daily at bedtime. 18.Simvastatin 1 tab by mouth daily. 19.Trihexyphenidyl 2 mg 1 tab by mouth daily at bedtime.  The patient is to stopped the following medications; 1. Enalapril 5 mg 1 tab by mouth twice  daily. 2. Metformin 500 mg 1 tab by mouth daily. 3. Pentoxifylline 400 mg 1-2 tablets by mouth twice daily. 4. Propranolol 10 mg 1 tab by mouth 3 times a day.  HOSPITAL COURSE:  [name] is an 77 year old female who presented from clinic with a large right foot dry gangrene, now status post below-the-knee amputation due to severe PAD.  1. Dry gangrene of the right foot.  The patient with ABIs on admission     which showed a right dorsalis pedis brachial index of 0.3.  Right     posterior tibialis 0.41.  Left dorsalis pedis 0.8.  Left posterior     tibialis 0.52.  Given severity of peripheral arterial disease,     Vascular Surgery was consulted for management of right foot dry     gangrene.  Vascular Surgery suggested an above-the-knee amputation     due to reduced blood flow.  This was completed on December 20, 2009.     Leading up to the operation, the patient was treated with     vancomycin and Zosyn empirically.  She showed no signs of systemic  infection throughout her hospitalization.  She was healing well     after her surgery with no erythema or pus from the above-the-knee     amputation.  The patient had PT and OT consult, that was     recommended Home Health PT and OT.  The suggestion was made by     providers that the patient may benefit from rehab but family is not     allowing this, prefer to take her home.  The patient was discharged     with home health RN and PT/OT as well as the patient continuously     have an 8 hour per day nursing aide.  The pain was controlled     postop with Vicodin 5/325 as the patient has had reactions to other     narcotics, but has had except with this in the past.  She has     hallucinations with morphine and codeine. 2. Hypertension, simplified medications regimen by switching to     atenolol and lisinopril from enalapril and propranolol.  This will     allow the patient have once daily dosing.  Discussed change with     patient's  primary care physician. 3. CAD, status post CABG.  Continue patient on aspirin and statin.     The patient with EF of 45% and moderate mitral regurgitation. 4. Hypothyroidism, continued on Synthroid. 5. Psychiatric, continued on all home medications, Zoloft, sertraline,     Abilify, p.r.n. Ativan and Wellbutrin.  Discussion with PCP of     possibly decreasing Wellbutrin to 300 mg once daily.  We will defer     to outpatient. 6. Dementia, continued on Aricept. 7. Tongue leukoplakia-the patient with suspiciously lesion on the     underside of her tongue on the right side.  Discussions were had     and decision was made to defer biopsy until outpatient.  The     patient will use magic mouthwash p.r.n. 8. PAD, severe.  The patient initially continued on home medication of     Trental.  Status post AKA, Trental was discontinued. 9. Macrocytic anemia-the patient with macrocytic anemia with a     hemoglobin baseline of around 10.  The patient had a B12 and a     folate level drawn which were within normal limits. Hemoglobin was     stable, so no further workup was pursued.  PROCEDURES:  Right above-the-knee amputation for right foot gangrene by Dr. Leonides Sake.  LABS: 1. BMET on day before discharge 143.8, 106, 25, 157, seg 0.72.  GFR of     76, calcium 8.7. 2. CBC on day before discharge.  White count 12.0, hemoglobin 10.1,     and stable.  Platelets 285 and MCV1 of 3.6. 3. Hemoglobin A1c of 5.6.  ESR at time of admission was 116.  CRP at     the time of admission 11.69 .  PATIENT'S CONDITION AT THE TIME OF DISCHARGE:  Stable and able to be discharged home.  DISCHARGE FOLLOWUP: 1. Dr. Earna Coder at [facility] on October 23, at 3 p.m. 2. Staple removal in 4 weeks by Vascular Dr. Imogene Burn.  Appointment will     need to be arranged. 3. The patient does have [employer] at home with contact number [phone].  FOLLOWUP ISSUES: 1. 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 2. Please make sure the patient has an appointment arranged with     Vascular Surgery in 4 weeks for staple removal.  Vascular Surgery     did not call back on the day of discharge, with an appointment     time. 3. Please follow up for signs or symptoms of infection at the wound site. 4. Please monitor the patient on new blood pressure simplified regimen. 5. Please consider decreasing Wellbutrin to 300 mg daily from 450 daily.    ______________________________ Tana Conch, MD   ______________________________ Dr. Deirdre Priest    SH/MEDQ  D:  12/25/2010  T:  12/25/2010  Job:  [id number]  cc:   Antoine Primas, DO Fransisco Hertz, MD  Electronically Signed by Tana Conch MD on 12/26/2010 07:31:29 PM Electronically Signed by Pearlean Brownie M.D. on 01/06/2011 02:42:38 PM

## 2011-01-17 ENCOUNTER — Encounter: Payer: Self-pay | Admitting: Vascular Surgery

## 2011-01-21 ENCOUNTER — Ambulatory Visit (INDEPENDENT_AMBULATORY_CARE_PROVIDER_SITE_OTHER): Payer: Medicare Other | Admitting: Vascular Surgery

## 2011-01-21 ENCOUNTER — Encounter: Payer: Self-pay | Admitting: Vascular Surgery

## 2011-01-21 VITALS — BP 115/72 | HR 61 | Temp 98.1°F | Ht 64.5 in | Wt 130.0 lb

## 2011-01-21 DIAGNOSIS — Z89619 Acquired absence of unspecified leg above knee: Secondary | ICD-10-CM | POA: Insufficient documentation

## 2011-01-21 DIAGNOSIS — I70269 Atherosclerosis of native arteries of extremities with gangrene, unspecified extremity: Secondary | ICD-10-CM | POA: Insufficient documentation

## 2011-01-21 DIAGNOSIS — S78119A Complete traumatic amputation at level between unspecified hip and knee, initial encounter: Secondary | ICD-10-CM

## 2011-01-21 NOTE — Progress Notes (Signed)
[facility] [place]  Postoperative Visit  History of Present Illness  [name] is a 77 y.o. female who presents for postoperative follow-up for: right above-the-knee amputation (Date: 12/21/10).  The patient's wounds are healed.  The patient notes pain is not well controlled.  The patient's current symptoms are: phantom pain.  Physical Examination  Filed Vitals:   01/21/11 1309  BP: 115/72  Pulse: 61  Temp: 98.1 F (36.7 C)   R AKA: Incision is healed.  Staples are intact.  Medical Decision Making  [name] is a 77 y.o. female who presents s/p right above-the-knee ampuation. The patient's stump is healing appropriately with phantom pain intermittently. I discussed in depth with the patient the nature of atherosclerosis, and emphasized the importance of maximal medical management including strict control of blood pressure, blood glucose, and lipid levels, obtaining regular exercise, and cessation of smoking.  The patient is aware that without maximal medical management the underlying atherosclerotic disease process will progress, possibly leading to a more proximal amputation. The patient agrees to participate in their maximal medical care.  Thank you for allowing [place] to participate in this patient's care.  The patient has not been referred for prosthetic fitting, as she does not normally ambulate  The patient can follow up with [place] as needed.  Leonides Sake, MD [facility] [place] Office: [phone] Pager: [phone]

## 2011-01-24 ENCOUNTER — Telehealth: Payer: Self-pay | Admitting: Family Medicine

## 2011-01-24 NOTE — Telephone Encounter (Signed)
Called and gave verbal order.

## 2011-01-24 NOTE — Telephone Encounter (Signed)
Please call with verbal auth to continue services 2 days weekly for 3wks duration to number given

## 2011-02-15 ENCOUNTER — Telehealth: Payer: Self-pay | Admitting: Family Medicine

## 2011-02-15 NOTE — Telephone Encounter (Signed)
Daughter stopped by and says that mom needs prescription for Lorazepam and Bupropion.  She is out.  She goes to [facility] on [address].  Please call her is you have any questions.

## 2011-02-16 MED ORDER — LORAZEPAM 0.5 MG PO TABS: 0.5000 mg | ORAL_TABLET | Freq: Two times a day (BID) | ORAL | Status: DC

## 2011-02-16 MED ORDER — BUPROPION HCL ER (XL) 150 MG PO TB24: 300.0000 mg | ORAL_TABLET | ORAL | Status: DC

## 2011-02-16 NOTE — Telephone Encounter (Signed)
Please call pt and daughter and tell her they are available for pickup at the front desk.

## 2011-02-16 NOTE — Telephone Encounter (Signed)
Will do when at the office. I would like to decrease the wellbutrin to 2 tabs daily.

## 2011-02-17 NOTE — Telephone Encounter (Signed)
Patient daughter informed, will be in to pick up rx's tomm.

## 2011-02-27 ENCOUNTER — Other Ambulatory Visit: Payer: Self-pay | Admitting: Family Medicine

## 2011-02-28 NOTE — Telephone Encounter (Signed)
Refill request

## 2011-03-01 ENCOUNTER — Encounter: Payer: Self-pay | Admitting: Family Medicine

## 2011-03-27 ENCOUNTER — Other Ambulatory Visit: Payer: Self-pay | Admitting: Family Medicine

## 2011-03-27 NOTE — Telephone Encounter (Signed)
Refill request

## 2011-03-29 ENCOUNTER — Encounter: Payer: Self-pay | Admitting: Internal Medicine

## 2011-03-30 ENCOUNTER — Encounter: Payer: Self-pay | Admitting: Internal Medicine

## 2011-04-04 ENCOUNTER — Ambulatory Visit (INDEPENDENT_AMBULATORY_CARE_PROVIDER_SITE_OTHER): Payer: Medicare Other | Admitting: *Deleted

## 2011-04-04 ENCOUNTER — Encounter: Payer: Self-pay | Admitting: Internal Medicine

## 2011-04-04 DIAGNOSIS — I442 Atrioventricular block, complete: Secondary | ICD-10-CM

## 2011-04-10 LAB — REMOTE PACEMAKER DEVICE
AL IMPEDENCE PM: 497 Ohm
RV LEAD IMPEDENCE PM: 452 Ohm

## 2011-04-11 NOTE — Progress Notes (Signed)
Remote pacer check  

## 2011-04-15 ENCOUNTER — Encounter: Payer: Self-pay | Admitting: *Deleted

## 2011-04-15 ENCOUNTER — Other Ambulatory Visit: Payer: Self-pay | Admitting: Family Medicine

## 2011-04-15 MED ORDER — LORAZEPAM 0.5 MG PO TABS: 0.5000 mg | ORAL_TABLET | Freq: Two times a day (BID) | ORAL | Status: DC

## 2011-04-22 ENCOUNTER — Encounter: Payer: Self-pay | Admitting: Cardiovascular Disease

## 2011-04-22 ENCOUNTER — Ambulatory Visit (INDEPENDENT_AMBULATORY_CARE_PROVIDER_SITE_OTHER): Payer: Medicare Other | Admitting: Cardiovascular Disease

## 2011-04-22 VITALS — BP 112/58 | HR 72

## 2011-04-22 DIAGNOSIS — Z950 Presence of cardiac pacemaker: Secondary | ICD-10-CM

## 2011-04-22 DIAGNOSIS — I2510 Atherosclerotic heart disease of native coronary artery without angina pectoris: Secondary | ICD-10-CM

## 2011-04-22 NOTE — Patient Instructions (Signed)
Your physician recommends that you schedule a follow-up appointment in: KEEP FOLLOW UP WITH  DR Graciela Husbands Your physician recommends that you continue on your current medications as directed. Please refer to the Current Medication list given to you today.

## 2011-04-22 NOTE — Assessment & Plan Note (Signed)
Cholesterol is at goal.  Continue current dose of statin and diet Rx.  No myalgias or side effects.  F/U  LFT's in 6 months. No results found for this basename: LDLCALC             

## 2011-04-22 NOTE — Assessment & Plan Note (Signed)
Some phantom pain.  Stump healed well.  F/U Dr Imogene Burn

## 2011-04-22 NOTE — Assessment & Plan Note (Signed)
Stable with no angina and good activity level.  Continue medical Rx  

## 2011-04-22 NOTE — Progress Notes (Signed)
Patient ID: [name], female   DOB: 31-Jul-1923, 78 y.o.   MRN: [id number] Candita is seen today for F/U CAD with distant history of CABG. She is wheel chair bound from neurological problems. She has not had any SSCP. Her bypass was in 1994. She has a pacer in for SSS and heart block. Initial  implant was in 2005 with syncope. She looks good today with less tremulousness. She has had no dyspnea, pnd,orthopnea or edema. Her pacer has been working well and she is not pacer dependant Dr. Isaias Cowman her primary would like to simplify her meds and I think this would be fine. She has a full time aid "Porshe" who was with her today. Echo 2012 reviewed and EF 40-45% with mild MR and severe LVH.   Needs to F/U with [facility] about pacer.  Does not need to see me in F/U  Advanced Altzheimers.  Recent AKA on right side from osteomyelitis  ROS: Denies fever, malais, weight loss, blurry vision, decreased visual acuity, cough, sputum, SOB, hemoptysis, pleuritic pain, palpitaitons, heartburn, abdominal pain, melena, lower extremity edema, claudication, or rash.  All other systems reviewed and negative  General: Affect appropriate Chronically ill tremulous in wheelchair HEENT: normal Neck supple with no adenopathy JVP normal no bruits no thyromegaly Lungs clear with no wheezing and good diaphragmatic motion Heart:  S1/S2 no murmur, no rub, gallop or click PMI normal Abdomen: benighn, BS positve, no tenderness, no AAA no bruit.  No HSM or HJR R AKA No edema Neuro non-focal but tremulous fro dementia Skin warm and dry LLE weak with right LE amputation   Current Outpatient Prescriptions  Medication Sig Dispense Refill  . ARIPiprazole (ABILIFY) 15 MG tablet Take 15 mg by mouth daily.        Marland Kitchen aspirin 81 MG EC tablet Take 81 mg by mouth daily.        Marland Kitchen atenolol (TENORMIN) 50 MG tablet Take 1 tablet (50 mg total) by mouth daily.  30 tablet  11  . Bioflavonoid Products (ESTER-C) 500-550 MG TABS Take 1 tablet by mouth  daily.        Marland Kitchen buPROPion (WELLBUTRIN XL) 150 MG 24 hr tablet Take 2 tablets (300 mg total) by mouth every morning.  180 tablet  1  . donepezil (ARICEPT) 10 MG tablet TAKE 1 TABLET BY MOUTH DAILY AT BEDTIME  32 tablet  1  . furosemide (LASIX) 40 MG tablet TAKE 1/2 TABLET BY MOUTH DAILY  45 tablet  3  . Glucerna (GLUCERNA) LIQD 1 to 3 cans by mouth daily for nutritional supplement       . HYDROcodone-acetaminophen (NORCO) 5-325 MG per tablet Take 1 tablet by mouth every 6 (six) hours as needed.        Marland Kitchen KLOR-CON M20 20 MEQ tablet TAKE 1 TABLET DAILY  45 tablet  3  . levothyroxine (SYNTHROID, LEVOTHROID) 25 MCG tablet TAKE 1 TABLET BY MOUTH DAILY  31 tablet  5  . lisinopril (PRINIVIL) 10 MG tablet Take 1 tablet (10 mg total) by mouth daily.  90 tablet  1  . LORazepam (ATIVAN) 0.5 MG tablet Take 1 tablet (0.5 mg total) by mouth 2 (two) times daily.  60 tablet  0  . metFORMIN (GLUCOPHAGE) 500 MG tablet TAKE 1 TABLET EVERY DAY  31 tablet  4  . mometasone (NASONEX) 50 MCG/ACT nasal spray 2 sprays by Nasal route as needed.        . Multiple Vitamin (MULTIVITAMIN) tablet Take 1  tablet by mouth daily.        . nitroGLYCERIN (NITROSTAT) 0.4 MG SL tablet Place 0.4 mg under the tongue every 5 (five) minutes as needed. For chest pain       . omeprazole (PRILOSEC) 20 MG capsule Take 20 mg by mouth 2 (two) times daily.        . pentoxifylline (TRENTAL) 400 MG CR tablet Take 1 tablet (400 mg total) by mouth 3 (three) times daily with meals.  90 tablet  6  . polyethylene glycol (MIRALAX) powder Take 17 g by mouth daily as needed.        Marland Kitchen PRILOSEC OTC 20 MG tablet 1 TAB BY MOUTH TWICE DAILY  60 tablet  10  . propranolol (INDERAL) 10 MG tablet TAKE 1 TABLET BY MOUTH 3 TIMES A DAY  90 tablet  3  . sertraline (ZOLOFT) 100 MG tablet Take 100 mg by mouth every morning.        . simvastatin (ZOCOR) 40 MG tablet TAKE 1 TABLET EVERY DAY  31 tablet  1  . trihexyphenidyl (ARTANE) 2 MG tablet Take 2 mg by mouth daily.           Allergies  Codeine; Latex; and Morphine  Electrocardiogram:  AV sequential pacing at rate of 72 RBBB pattern in V103 and LAD  Assessment and Plan

## 2011-04-22 NOTE — Assessment & Plan Note (Signed)
F/U Dr Graciela Husbands Continue home monitoring  Not pacer dependant

## 2011-04-22 NOTE — Assessment & Plan Note (Signed)
Advanced.  Has good aid/family support  Should discuss advanced directives with primary

## 2011-04-25 ENCOUNTER — Telehealth: Payer: Self-pay | Admitting: Internal Medicine

## 2011-04-25 NOTE — Telephone Encounter (Signed)
03-30-11 sent past due letter/mt 04-25-11 pt made pacer ck with klein 05-17-11/mt

## 2011-04-28 ENCOUNTER — Telehealth: Payer: Self-pay | Admitting: Family Medicine

## 2011-04-28 DIAGNOSIS — L8991 Pressure ulcer of unspecified site, stage 1: Secondary | ICD-10-CM

## 2011-04-28 NOTE — Telephone Encounter (Signed)
Calling from [facility] to let Dr. Katrinka Blazing know that the patient isn't ambulating well.  Her skin is in bad shape and due to not wanting to position well, they feel the need for a wound care nurse for a Sacral Wound and a tear just to the side.  She could also benefit from a gel cushion because a donut won't the job.  Nurse asked the daughter to make her an appt to be seen but she would not do it.

## 2011-04-29 NOTE — Telephone Encounter (Signed)
Called home care center back. They're concerned that patient is not getting good care at home. They're trying to do with a can every day but is not getting any 8 from family. At this time on physical exam they state that she's had a stage I pressure ulcer and they're concerned her be more breakdown over the weekend. They attempted to have daughter bring patient in for evaluation and patient's daughter declined. At this time out in advance home health nurse to go out for wound care with patient. Attempted to call home with no answer. Very concerned because it sounds like now there is a dog 63 cats and other animals in the house daughter is not being very active in her mom's care. We'll continue to follow.

## 2011-04-29 NOTE — Telephone Encounter (Signed)
Faxed order to [facility]. Shaunna Rosetti, Maryjo [place]

## 2011-05-02 ENCOUNTER — Other Ambulatory Visit: Payer: Self-pay | Admitting: Family Medicine

## 2011-05-02 NOTE — Telephone Encounter (Signed)
Refill request

## 2011-05-04 ENCOUNTER — Other Ambulatory Visit: Payer: Self-pay | Admitting: Family Medicine

## 2011-05-05 NOTE — Telephone Encounter (Signed)
Refill request

## 2011-05-09 ENCOUNTER — Telehealth: Payer: Self-pay | Admitting: Family Medicine

## 2011-05-09 NOTE — Telephone Encounter (Signed)
Non urgent message for the nurse.  She would like an Rx for an Roho cushion Dx Multiple Stage 2 to Sacral Area and any other Dx pertaining to amputation, diabetes or limited mobility that causes wound care.  Fax to [phone].

## 2011-05-10 MED ORDER — SACRO-CUSHION MISC: 1.0000 | Freq: Every day | Status: DC

## 2011-05-10 NOTE — Telephone Encounter (Signed)
Faxed. Nayellie Sanseverino Dawn  

## 2011-05-10 NOTE — Telephone Encounter (Signed)
Filled linked to ulcer/DM.  Will have staff send in RX when they have time.

## 2011-05-12 ENCOUNTER — Other Ambulatory Visit: Payer: Self-pay | Admitting: Family Medicine

## 2011-05-12 NOTE — Telephone Encounter (Signed)
Refill request

## 2011-05-17 ENCOUNTER — Encounter: Payer: Self-pay | Admitting: Internal Medicine

## 2011-05-17 ENCOUNTER — Ambulatory Visit (INDEPENDENT_AMBULATORY_CARE_PROVIDER_SITE_OTHER): Payer: Medicare Other | Admitting: Internal Medicine

## 2011-05-17 DIAGNOSIS — I442 Atrioventricular block, complete: Secondary | ICD-10-CM

## 2011-05-17 DIAGNOSIS — Z950 Presence of cardiac pacemaker: Secondary | ICD-10-CM

## 2011-05-17 DIAGNOSIS — R259 Unspecified abnormal involuntary movements: Secondary | ICD-10-CM

## 2011-05-17 LAB — PACEMAKER DEVICE OBSERVATION
AL AMPLITUDE: 2.4 mv
AL IMPEDENCE PM: 564 Ohm
AL THRESHOLD: 0.5 V
RV LEAD IMPEDENCE PM: 445 Ohm
RV LEAD THRESHOLD: 0.5 V

## 2011-05-17 NOTE — Assessment & Plan Note (Signed)
The patient's device was interrogated.  The information was reviewed. No changes were made in the programming.    

## 2011-05-17 NOTE — Progress Notes (Signed)
HPI  [name] is a 78 y.o. female seen in followup for pacemaker implanted for complete heart block in 2005. This was associated with syncope. She has had no recurrent syncope.  Echo 2012 reviewed and EF 40-45% with mild MR and severe LVH.    She has a history of ischemic heart disease with prior bypass surgery. She had no problems with chest pain.. There has been no peripheral edema; there is no nocturnal dyspnea.  She recently had some shortness of breath at night but this has resolved.  She is now nonambulatory and wheelchair following right AKA  Past Medical History  Diagnosis Date  . Allergy   . Cancer     breast  . CHF (congestive heart failure)   . Depression   . GERD (gastroesophageal reflux disease)   . Heart murmur   . Hyperlipidemia   . Hypertension   . Myocardial infarction   . Thyroid disease   . Diabetes mellitus   . Anemia   . Bronchitis   . Bipolar disorder   . CAD (coronary artery disease)   . Anxiety   . Dementia     Past Surgical History  Procedure Date  . Breast surgery   . Coronary artery bypass graft   . Mastectomy   . Cholecystectomy   . Above knee leg amputation 12/21/10    right    Current Outpatient Prescriptions  Medication Sig Dispense Refill  . ARIPiprazole (ABILIFY) 15 MG tablet Take 15 mg by mouth daily.        Marland Kitchen aspirin 81 MG EC tablet Take 81 mg by mouth daily.        Marland Kitchen atenolol (TENORMIN) 50 MG tablet TAKE 1 TABLET BY MOUTH DAILY  30 tablet  2  . Bioflavonoid Products (ESTER-C) 500-550 MG TABS Take 1 tablet by mouth daily.        Marland Kitchen buPROPion (WELLBUTRIN XL) 150 MG 24 hr tablet Take 2 tablets (300 mg total) by mouth every morning.  180 tablet  1  . donepezil (ARICEPT) 10 MG tablet TAKE 1 TABLET BY MOUTH DAILY AT BEDTIME  32 tablet  1  . Elastic Bandages & Supports (SACRO-CUSHION) MISC 1 Device by Does not apply route daily. Please give Roho cushion if available.  1 each  1  . furosemide (LASIX) 40 MG tablet TAKE 1/2 TABLET  BY MOUTH DAILY  45 tablet  3  . Glucerna (GLUCERNA) LIQD 1 to 3 cans by mouth daily for nutritional supplement       . HYDROcodone-acetaminophen (NORCO) 5-325 MG per tablet Take 1 tablet by mouth every 6 (six) hours as needed.        Marland Kitchen KLOR-CON M20 20 MEQ tablet TAKE 1 TABLET DAILY  45 tablet  3  . levothyroxine (SYNTHROID, LEVOTHROID) 25 MCG tablet TAKE 1 TABLET BY MOUTH DAILY  31 tablet  5  . lisinopril (PRINIVIL) 10 MG tablet Take 1 tablet (10 mg total) by mouth daily.  90 tablet  1  . LORazepam (ATIVAN) 0.5 MG tablet Take 1 tablet (0.5 mg total) by mouth 2 (two) times daily.  60 tablet  0  . metFORMIN (GLUCOPHAGE) 500 MG tablet TAKE 1 TABLET EVERY DAY  31 tablet  4  . mometasone (NASONEX) 50 MCG/ACT nasal spray 2 sprays by Nasal route as needed.        . Multiple Vitamin (MULTIVITAMIN) tablet Take 1 tablet by mouth daily.        . nitroGLYCERIN (NITROSTAT) 0.4  MG SL tablet Place 0.4 mg under the tongue every 5 (five) minutes as needed. For chest pain       . polyethylene glycol powder (GLYCOLAX/MIRALAX) powder TAKE 17 GRAMS DAILY BY MOUTH AS NEEDED  527 g  0  . PRILOSEC OTC 20 MG tablet 1 TAB BY MOUTH TWICE DAILY  60 tablet  10  . propranolol (INDERAL) 10 MG tablet TAKE 1 TABLET BY MOUTH 3 TIMES A DAY  90 tablet  3  . sertraline (ZOLOFT) 100 MG tablet Take 100 mg by mouth every morning.        . simvastatin (ZOCOR) 40 MG tablet TAKE 1 TABLET EVERY DAY  31 tablet  1  . trihexyphenidyl (ARTANE) 2 MG tablet Take 2 mg by mouth daily.          Allergies  Allergen Reactions  . Codeine   . Latex   . Morphine     Review of Systems negative except from HPI and PMH  Physical Exam BP 130/55  Pulse 66  SpO2 97% Well developed and well nourished in no acute distress sitting in a wheelchair HENT normal E scleral and icterus clear Neck Supple JVP flat; carotids brisk and full Clear to ausculation Regular rate and rhythm, no murmurs gallops or rub Soft with active bowel sounds No clubbing  cyanosis none Edema Alert  , grossly normal motor and sensory function  Skin Warm and Dry   Assessment and  Plan

## 2011-05-17 NOTE — Patient Instructions (Signed)
Your physician has recommended you make the following change in your medication:  1) Stop atenolol.  Remote monitoring is used to monitor your Pacemaker of ICD from home. This monitoring reduces the number of office visits required to check your device to one time per year. It allows [place] to keep an eye on the functioning of your device to ensure it is working properly. You are scheduled for a device check from home on 06/16/11. You may send your transmission at any time that day. If you have a wireless device, the transmission will be sent automatically. After your physician reviews your transmission, you will receive a postcard with your next transmission date.

## 2011-05-17 NOTE — Assessment & Plan Note (Signed)
Stable post pacing 

## 2011-05-17 NOTE — Assessment & Plan Note (Signed)
She is on double beta blocker therapy.  Inderal apparently helps with her tremor more than does the atenolol; we will stop the LAD

## 2011-05-18 ENCOUNTER — Other Ambulatory Visit: Payer: Self-pay | Admitting: Family Medicine

## 2011-05-18 NOTE — Telephone Encounter (Signed)
Refill request

## 2011-06-07 ENCOUNTER — Other Ambulatory Visit: Payer: Self-pay | Admitting: Family Medicine

## 2011-06-16 ENCOUNTER — Encounter: Payer: Self-pay | Admitting: Internal Medicine

## 2011-06-16 ENCOUNTER — Ambulatory Visit (INDEPENDENT_AMBULATORY_CARE_PROVIDER_SITE_OTHER): Payer: Medicare Other | Admitting: *Deleted

## 2011-06-16 DIAGNOSIS — I442 Atrioventricular block, complete: Secondary | ICD-10-CM

## 2011-06-20 LAB — REMOTE PACEMAKER DEVICE
AL IMPEDENCE PM: 589 Ohm
ATRIAL PACING PM: 80
BAMS-0001: 175 {beats}/min
BATTERY VOLTAGE: 2.61 V
BRDY-0004RV: 130 {beats}/min

## 2011-06-24 ENCOUNTER — Encounter: Payer: Self-pay | Admitting: *Deleted

## 2011-06-29 ENCOUNTER — Other Ambulatory Visit: Payer: Self-pay | Admitting: Internal Medicine

## 2011-06-29 DIAGNOSIS — R0989 Other specified symptoms and signs involving the circulatory and respiratory systems: Secondary | ICD-10-CM

## 2011-06-29 LAB — PACEMAKER DEVICE OBSERVATION
AL IMPEDENCE PM: 366 Ohm
BAMS-0001: 150 {beats}/min
BATTERY VOLTAGE: 2.6 V
BATTERY VOLTAGE: 2.6 V
VENTRICULAR PACING PM: 77.7

## 2011-06-30 NOTE — Progress Notes (Signed)
Remote pacer check  

## 2011-07-08 ENCOUNTER — Other Ambulatory Visit: Payer: Self-pay | Admitting: Family Medicine

## 2011-07-21 ENCOUNTER — Encounter: Payer: Medicare Other | Admitting: *Deleted

## 2011-07-27 ENCOUNTER — Ambulatory Visit (INDEPENDENT_AMBULATORY_CARE_PROVIDER_SITE_OTHER): Payer: Medicare Other | Admitting: *Deleted

## 2011-07-27 DIAGNOSIS — Z950 Presence of cardiac pacemaker: Secondary | ICD-10-CM

## 2011-07-27 DIAGNOSIS — I442 Atrioventricular block, complete: Secondary | ICD-10-CM

## 2011-07-28 ENCOUNTER — Other Ambulatory Visit: Payer: Self-pay | Admitting: Family Medicine

## 2011-07-29 ENCOUNTER — Encounter: Payer: Self-pay | Admitting: *Deleted

## 2011-08-01 ENCOUNTER — Other Ambulatory Visit: Payer: Self-pay | Admitting: Family Medicine

## 2011-08-01 LAB — REMOTE PACEMAKER DEVICE
BATTERY VOLTAGE: 2.62 V
RV LEAD IMPEDENCE PM: 474 Ohm
VENTRICULAR PACING PM: 100

## 2011-08-05 ENCOUNTER — Other Ambulatory Visit: Payer: Self-pay | Admitting: Family Medicine

## 2011-08-11 ENCOUNTER — Other Ambulatory Visit: Payer: Self-pay | Admitting: Family Medicine

## 2011-08-12 ENCOUNTER — Encounter: Payer: Self-pay | Admitting: Internal Medicine

## 2011-08-12 ENCOUNTER — Ambulatory Visit (INDEPENDENT_AMBULATORY_CARE_PROVIDER_SITE_OTHER): Payer: Medicare Other | Admitting: Internal Medicine

## 2011-08-12 ENCOUNTER — Encounter: Payer: Self-pay | Admitting: *Deleted

## 2011-08-12 VITALS — BP 110/62 | HR 68 | Ht 64.0 in | Wt 108.0 lb

## 2011-08-12 DIAGNOSIS — I2510 Atherosclerotic heart disease of native coronary artery without angina pectoris: Secondary | ICD-10-CM

## 2011-08-12 DIAGNOSIS — I25810 Atherosclerosis of coronary artery bypass graft(s) without angina pectoris: Secondary | ICD-10-CM

## 2011-08-12 DIAGNOSIS — I442 Atrioventricular block, complete: Secondary | ICD-10-CM

## 2011-08-12 DIAGNOSIS — Z950 Presence of cardiac pacemaker: Secondary | ICD-10-CM

## 2011-08-12 LAB — CBC WITH DIFFERENTIAL/PLATELET
Basophils Absolute: 0 10*3/uL (ref 0.0–0.1)
Basophils Relative: 0.4 % (ref 0.0–3.0)
Eosinophils Absolute: 0.3 10*3/uL (ref 0.0–0.7)
HCT: 35.4 % — ABNORMAL LOW (ref 36.0–46.0)
Hemoglobin: 11.4 g/dL — ABNORMAL LOW (ref 12.0–15.0)
Lymphs Abs: 2.7 10*3/uL (ref 0.7–4.0)
MCHC: 32.3 g/dL (ref 30.0–36.0)
Neutro Abs: 6.5 10*3/uL (ref 1.4–7.7)
RDW: 15.5 % — ABNORMAL HIGH (ref 11.5–14.6)

## 2011-08-12 LAB — BASIC METABOLIC PANEL
CO2: 28 mEq/L (ref 19–32)
Chloride: 106 mEq/L (ref 96–112)
Glucose, Bld: 76 mg/dL (ref 70–99)
Potassium: 5.2 mEq/L — ABNORMAL HIGH (ref 3.5–5.1)
Sodium: 143 mEq/L (ref 135–145)

## 2011-08-12 NOTE — Progress Notes (Signed)
HPI  [name] is a 78 y.o. female Seen in followup for pacemaker implantation for complete heart block in 2005. She also is a history of ischemic heart disease with prior bypass surgery. Echo 2012 demonstrated EF 40-45% with severe left ventricular hypertrophy  Her device has reached ERI. She has noted no change in symptoms associated with that event.  Past Medical History  Diagnosis Date  . Allergy   . Cancer     breast  . CHF (congestive heart failure)   . Depression   . GERD (gastroesophageal reflux disease)   . Heart murmur   . Hyperlipidemia   . Hypertension   . Myocardial infarction   . Thyroid disease   . Diabetes mellitus   . Anemia   . Bronchitis   . Bipolar disorder   . CAD (coronary artery disease)   . Anxiety   . Dementia     Past Surgical History  Procedure Date  . Breast surgery   . Coronary artery bypass graft   . Mastectomy   . Cholecystectomy   . Above knee leg amputation 12/21/10    right    Current Outpatient Prescriptions  Medication Sig Dispense Refill  . ABILIFY 15 MG tablet TAKE 1 TABLET EVERY DAY  30 tablet  9  . aspirin 81 MG EC tablet Take 81 mg by mouth daily.        . Bioflavonoid Products (ESTER-C) 500-550 MG TABS Take 1 tablet by mouth daily.        . buPROPion (WELLBUTRIN XL) 150 MG 24 hr tablet TAKE 2 TABLETS BY MOUTH EVERY MORNING  180 tablet  1  . donepezil (ARICEPT) 10 MG tablet TAKE 1 TABLET BY MOUTH DAILY AT BEDTIME  32 tablet  1  . Elastic Bandages & Supports (SACRO-CUSHION) MISC 1 Device by Does not apply route daily. Please give Roho cushion if available.  1 each  1  . furosemide (LASIX) 40 MG tablet TAKE 1/2 TABLET BY MOUTH DAILY  45 tablet  3  . Glucerna (GLUCERNA) LIQD 1 to 3 cans by mouth daily for nutritional supplement       . KLOR-CON M20 20 MEQ tablet TAKE 1 TABLET DAILY  45 tablet  3  . levothyroxine (SYNTHROID, LEVOTHROID) 25 MCG tablet TAKE 1 TABLET BY MOUTH DAILY  31 tablet  5  . lisinopril (PRINIVIL) 10 MG  tablet Take 1 tablet (10 mg total) by mouth daily.  90 tablet  1  . LORazepam (ATIVAN) 0.5 MG tablet Take 1 tablet (0.5 mg total) by mouth 2 (two) times daily.  60 tablet  0  . metFORMIN (GLUCOPHAGE) 500 MG tablet TAKE 1 TABLET EVERY DAY  31 tablet  4  . mometasone (NASONEX) 50 MCG/ACT nasal spray 2 sprays by Nasal route as needed.        . Multiple Vitamin (MULTIVITAMIN) tablet Take 1 tablet by mouth daily.        . nitroGLYCERIN (NITROSTAT) 0.4 MG SL tablet Place 0.4 mg under the tongue every 5 (five) minutes as needed. For chest pain       . polyethylene glycol powder (GLYCOLAX/MIRALAX) powder TAKE 17 GRAMS DAILY BY MOUTH AS NEEDED  527 g  0  . PRILOSEC OTC 20 MG tablet 1 TAB BY MOUTH TWICE DAILY  60 tablet  10  . propranolol (INDERAL) 10 MG tablet TAKE 1 TABLET BY MOUTH 3 TIMES A DAY  90 tablet  3  . sertraline (ZOLOFT) 100 MG tablet Take   100 mg by mouth every morning.        . simvastatin (ZOCOR) 40 MG tablet TAKE 1 TABLET EVERY DAY  31 tablet  1  . trihexyphenidyl (ARTANE) 2 MG tablet TAKE 1 TABLET EVERY DAY  30 tablet  8    Allergies  Allergen Reactions  . Codeine   . Latex   . Morphine     Review of Systems negative except from HPI and PMH  Physical Exam BP 110/62  Pulse 68  Ht 5' 4" (1.626 m)  Wt 108 lb (48.988 kg)  BMI 18.54 kg/m2 Well developed and well nourished in no acute distress sitting in a wheelchair HENT normal E scleral and icterus clear Neck Supple JVP flat; carotids brisk and full Clear to ausculation Device pocket well-healed-left-sided Regular rate and rhythm, no murmurs gallops or rub Soft with active bowel sounds No clubbing cyanosis none Edema Status post BKA Alert and oriented, grossly normal motor and sensory function Skin Warm and Dry    Assessment and  Plan  

## 2011-08-12 NOTE — Assessment & Plan Note (Signed)
The patient's device was interrogated.  The information was reviewed. No changes were made in the programming.   As above  

## 2011-08-12 NOTE — Patient Instructions (Signed)
Your physician has recommended that you have a pacemaker generator change. Please see the instruction sheet given to you today for more information.    

## 2011-08-12 NOTE — Assessment & Plan Note (Signed)
Her device has reached ERI  We have reviewed the benefits and risks of generator replacement.  These include but are not limited to lead fracture and infection.  The patient understands, agrees and is willing to proceed.    

## 2011-08-12 NOTE — Assessment & Plan Note (Signed)
Stable

## 2011-08-15 ENCOUNTER — Encounter (HOSPITAL_COMMUNITY): Payer: Self-pay | Admitting: Pharmacy Technician

## 2011-08-16 ENCOUNTER — Other Ambulatory Visit: Payer: Self-pay | Admitting: *Deleted

## 2011-08-16 MED ORDER — GENTAMICIN SULFATE 40 MG/ML IJ SOLN
80.0000 mg | INTRAMUSCULAR | Status: AC
  Filled 2011-08-16: qty 2

## 2011-08-16 MED ORDER — SERTRALINE HCL 100 MG PO TABS: 100.0000 mg | ORAL_TABLET | ORAL | Status: DC

## 2011-08-16 MED ORDER — SODIUM CHLORIDE 0.9 % IJ SOLN: 3.0000 mL | INTRAMUSCULAR | Status: DC | PRN

## 2011-08-16 MED ORDER — CHLORHEXIDINE GLUCONATE 4 % EX LIQD
60.0000 mL | Freq: Once | CUTANEOUS | Status: DC
  Filled 2011-08-16: qty 60

## 2011-08-16 MED ORDER — SODIUM CHLORIDE 0.9 % IJ SOLN: 3.0000 mL | Freq: Two times a day (BID) | INTRAMUSCULAR | Status: DC

## 2011-08-16 MED ORDER — CEFAZOLIN SODIUM-DEXTROSE 2-3 GM-% IV SOLR
2.0000 g | INTRAVENOUS | Status: AC
  Filled 2011-08-16: qty 50

## 2011-08-16 MED ORDER — SODIUM CHLORIDE 0.9 % IV SOLN
250.0000 mL | INTRAVENOUS | Status: DC
  Administered 2011-08-17: 1000 mL via INTRAVENOUS

## 2011-08-17 ENCOUNTER — Encounter (HOSPITAL_COMMUNITY)
Admission: RE | Disposition: A | Discharge status: Home / Self Care | Payer: Self-pay | Source: Ambulatory Visit | Attending: Internal Medicine

## 2011-08-17 ENCOUNTER — Ambulatory Visit (HOSPITAL_COMMUNITY)
Admission: RE | Admit: 2011-08-17 | Discharge: 2011-08-17 | Disposition: A | Discharge status: Home / Self Care | Payer: Medicare Other | Source: Ambulatory Visit | Attending: Internal Medicine | Admitting: Internal Medicine

## 2011-08-17 ENCOUNTER — Telehealth: Payer: Self-pay | Admitting: Internal Medicine

## 2011-08-17 DIAGNOSIS — K219 Gastro-esophageal reflux disease without esophagitis: Secondary | ICD-10-CM | POA: Insufficient documentation

## 2011-08-17 DIAGNOSIS — I2510 Atherosclerotic heart disease of native coronary artery without angina pectoris: Secondary | ICD-10-CM | POA: Insufficient documentation

## 2011-08-17 DIAGNOSIS — E119 Type 2 diabetes mellitus without complications: Secondary | ICD-10-CM | POA: Insufficient documentation

## 2011-08-17 DIAGNOSIS — Z45018 Encounter for adjustment and management of other part of cardiac pacemaker: Secondary | ICD-10-CM | POA: Insufficient documentation

## 2011-08-17 DIAGNOSIS — I25810 Atherosclerosis of coronary artery bypass graft(s) without angina pectoris: Secondary | ICD-10-CM

## 2011-08-17 DIAGNOSIS — I10 Essential (primary) hypertension: Secondary | ICD-10-CM | POA: Insufficient documentation

## 2011-08-17 DIAGNOSIS — I442 Atrioventricular block, complete: Secondary | ICD-10-CM

## 2011-08-17 DIAGNOSIS — Z853 Personal history of malignant neoplasm of breast: Secondary | ICD-10-CM | POA: Insufficient documentation

## 2011-08-17 DIAGNOSIS — Z950 Presence of cardiac pacemaker: Secondary | ICD-10-CM

## 2011-08-17 DIAGNOSIS — E785 Hyperlipidemia, unspecified: Secondary | ICD-10-CM | POA: Insufficient documentation

## 2011-08-17 DIAGNOSIS — F319 Bipolar disorder, unspecified: Secondary | ICD-10-CM | POA: Insufficient documentation

## 2011-08-17 DIAGNOSIS — I509 Heart failure, unspecified: Secondary | ICD-10-CM | POA: Insufficient documentation

## 2011-08-17 HISTORY — PX: PERMANENT PACEMAKER GENERATOR CHANGE: SHX6022

## 2011-08-17 SURGERY — PERMANENT PACEMAKER GENERATOR CHANGE
Anesthesia: LOCAL

## 2011-08-17 MED ORDER — MIDAZOLAM HCL 5 MG/5ML IJ SOLN
INTRAMUSCULAR | Status: AC
  Filled 2011-08-17: qty 5

## 2011-08-17 MED ORDER — LIDOCAINE HCL (PF) 1 % IJ SOLN
INTRAMUSCULAR | Status: AC
  Filled 2011-08-17: qty 60

## 2011-08-17 MED ORDER — MUPIROCIN 2 % EX OINT
TOPICAL_OINTMENT | CUTANEOUS | Status: AC
  Filled 2011-08-17: qty 22

## 2011-08-17 MED ORDER — CEFAZOLIN SODIUM-DEXTROSE 2-3 GM-% IV SOLR
INTRAVENOUS | Status: AC
  Filled 2011-08-17: qty 50

## 2011-08-17 MED ORDER — MUPIROCIN 2 % EX OINT: TOPICAL_OINTMENT | Freq: Two times a day (BID) | CUTANEOUS | Status: AC

## 2011-08-17 MED ORDER — SODIUM CHLORIDE 0.9 % IJ SOLN: 3.0000 mL | Freq: Two times a day (BID) | INTRAMUSCULAR | Status: DC

## 2011-08-17 MED ORDER — MUPIROCIN 2 % EX OINT
TOPICAL_OINTMENT | Freq: Two times a day (BID) | CUTANEOUS | Status: DC
  Administered 2011-08-17: 09:00:00 via NASAL
  Filled 2011-08-17: qty 22

## 2011-08-17 MED ORDER — FENTANYL CITRATE 0.05 MG/ML IJ SOLN
INTRAMUSCULAR | Status: AC
  Filled 2011-08-17: qty 2

## 2011-08-17 MED ORDER — SODIUM CHLORIDE 0.9 % IJ SOLN: 3.0000 mL | INTRAMUSCULAR | Status: DC | PRN

## 2011-08-17 NOTE — Interval H&P Note (Signed)
History and Physical Interval Note:  08/17/2011 9:44 AM  [name]  has presented today for surgery, with the diagnosis of eri/complete heart block  The various methods of treatment have been discussed with the patient and family. After consideration of risks, benefits and other options for treatment, the patient has consented to  Procedure(s) (LRB): PERMANENT PACEMAKER GENERATOR CHANGE (N/A) as a surgical intervention .  The patients' history has been reviewed, patient examined, no change in status, stable for surgery.  I have reviewed the patients' chart and labs.  Questions were answered to the patient's satisfaction.     Sherryl Manges  Reviewed with family

## 2011-08-17 NOTE — H&P (View-Only) (Signed)
HPI  [name] is a 78 y.o. female Seen in followup for pacemaker implantation for complete heart block in 2005. She also is a history of ischemic heart disease with prior bypass surgery. Echo 2012 demonstrated EF 40-45% with severe left ventricular hypertrophy  Her device has reached ERI. She has noted no change in symptoms associated with that event.  Past Medical History  Diagnosis Date  . Allergy   . Cancer     breast  . CHF (congestive heart failure)   . Depression   . GERD (gastroesophageal reflux disease)   . Heart murmur   . Hyperlipidemia   . Hypertension   . Myocardial infarction   . Thyroid disease   . Diabetes mellitus   . Anemia   . Bronchitis   . Bipolar disorder   . CAD (coronary artery disease)   . Anxiety   . Dementia     Past Surgical History  Procedure Date  . Breast surgery   . Coronary artery bypass graft   . Mastectomy   . Cholecystectomy   . Above knee leg amputation 12/21/10    right    Current Outpatient Prescriptions  Medication Sig Dispense Refill  . ABILIFY 15 MG tablet TAKE 1 TABLET EVERY DAY  30 tablet  9  . aspirin 81 MG EC tablet Take 81 mg by mouth daily.        Marland Kitchen Bioflavonoid Products (ESTER-C) 500-550 MG TABS Take 1 tablet by mouth daily.        Marland Kitchen buPROPion (WELLBUTRIN XL) 150 MG 24 hr tablet TAKE 2 TABLETS BY MOUTH EVERY MORNING  180 tablet  1  . donepezil (ARICEPT) 10 MG tablet TAKE 1 TABLET BY MOUTH DAILY AT BEDTIME  32 tablet  1  . Elastic Bandages & Supports (SACRO-CUSHION) MISC 1 Device by Does not apply route daily. Please give Roho cushion if available.  1 each  1  . furosemide (LASIX) 40 MG tablet TAKE 1/2 TABLET BY MOUTH DAILY  45 tablet  3  . Glucerna (GLUCERNA) LIQD 1 to 3 cans by mouth daily for nutritional supplement       . KLOR-CON M20 20 MEQ tablet TAKE 1 TABLET DAILY  45 tablet  3  . levothyroxine (SYNTHROID, LEVOTHROID) 25 MCG tablet TAKE 1 TABLET BY MOUTH DAILY  31 tablet  5  . lisinopril (PRINIVIL) 10 MG  tablet Take 1 tablet (10 mg total) by mouth daily.  90 tablet  1  . LORazepam (ATIVAN) 0.5 MG tablet Take 1 tablet (0.5 mg total) by mouth 2 (two) times daily.  60 tablet  0  . metFORMIN (GLUCOPHAGE) 500 MG tablet TAKE 1 TABLET EVERY DAY  31 tablet  4  . mometasone (NASONEX) 50 MCG/ACT nasal spray 2 sprays by Nasal route as needed.        . Multiple Vitamin (MULTIVITAMIN) tablet Take 1 tablet by mouth daily.        . nitroGLYCERIN (NITROSTAT) 0.4 MG SL tablet Place 0.4 mg under the tongue every 5 (five) minutes as needed. For chest pain       . polyethylene glycol powder (GLYCOLAX/MIRALAX) powder TAKE 17 GRAMS DAILY BY MOUTH AS NEEDED  527 g  0  . PRILOSEC OTC 20 MG tablet 1 TAB BY MOUTH TWICE DAILY  60 tablet  10  . propranolol (INDERAL) 10 MG tablet TAKE 1 TABLET BY MOUTH 3 TIMES A DAY  90 tablet  3  . sertraline (ZOLOFT) 100 MG tablet Take  100 mg by mouth every morning.        . simvastatin (ZOCOR) 40 MG tablet TAKE 1 TABLET EVERY DAY  31 tablet  1  . trihexyphenidyl (ARTANE) 2 MG tablet TAKE 1 TABLET EVERY DAY  30 tablet  8    Allergies  Allergen Reactions  . Codeine   . Latex   . Morphine     Review of Systems negative except from HPI and PMH  Physical Exam BP 110/62  Pulse 68  Ht 5\' 4"  (1.626 m)  Wt 108 lb (48.988 kg)  BMI 18.54 kg/m2 Well developed and well nourished in no acute distress sitting in a wheelchair HENT normal E scleral and icterus clear Neck Supple JVP flat; carotids brisk and full Clear to ausculation Device pocket well-healed-left-sided Regular rate and rhythm, no murmurs gallops or rub Soft with active bowel sounds No clubbing cyanosis none Edema Status post BKA Alert and oriented, grossly normal motor and sensory function Skin Warm and Dry    Assessment and  Plan

## 2011-08-17 NOTE — Discharge Instructions (Signed)
Pacemaker Battery Change A pacemaker battery usually lasts 4 to 12 years. Once or twice per year, you will be asked to visit your caregiver to have a full evaluation of your pacemaker. When a battery needs to be replaced, the entire pacemaker is actually replaced so that you can benefit from new circuitry and any new features that have recently been added to pacemakers. Most often, this procedure is very simple because the leads are already in place. After giving medicine to numb the skin, your health care provider makes a cut to reopen the pocket holding the pacemaker and disconnects the old device from its leads. The leads are routinely tested at this time. If they are working okay, the new pacemaker may simply be connected to the existing leads. If there is any problem with the old lead system, it may be wise to replace the lead system while inserting the new pacemaker. There are many things that affect how long a pacemaker battery will last:  Age of the pacemaker.   Number of leads (1, 2 or 3).   Pacemaker work load. If the pacemaker is helping the heart more often, then the battery will not last as long as if the pacemaker does not need to help the heart.   Resistance of the leads. The greater the resistance, the greater the drain on the battery. This can increase as the leads get older or if one or more of the leads does not have the best contact with the heart.   Power (voltage) settings.   The health of the person's heart. If the health of the heart gets worse, then the pacemaker may have to work more often and the setting changed to accommodate these changes.  Your health care provider will be alerted to the fact that it is time to replace the battery during follow-up exams. He or she will check your pacemaker using a small table-top computer, called a programmer, and a wand. The wand is about the same size as a remote control. Your provider puts the wand on your body in the area where the  pacemaker is located. Information from the pacemaker is received about how well your heart is working and the status of the battery. It is not painful, and it usually takes just a few minutes. You will have plenty of time before the battery is fully used up to plan for replacement.  LET YOUR CAREGIVER KNOW ABOUT:   Symptoms of chest pain, trouble breathing, palpitations, lightheadedness, or feelings of an abnormal or irregular heart beat.   Allergies.   Medications taken including herbs, eye drops, over the counter medications, and creams   Use of steroids (by mouth or creams).   Possible pregnancy, if applicable.   Previous problems with anesthetics or Novocaine.   History of blood clots (thrombophlebitis).   History of bleeding or blood problems.   Surgery since your last pacemaker placement.   Other health problems.  RISKS AND COMPLICATIONS These are very uncommon but include:  Bleeding.   Bruising of the skin around where the incision was made.   Pain at the site of the incision.   Pulling apart of the skin at the incision site.   Infection.   Allergic reaction to anesthetics or medicines used during the procedure.  Diabetics may have a temporary increase in their blood sugar after any surgical procedure.  BEFORE THE PROCEDURE  Wash all of the skin around the area of the chest where the pacemaker is located.   Try to remove any loose, scaling skin. Unless advised otherwise, avoid using aspirin, ibuprofen, or naproxen for 3-4 days before the procedure. Ask your caregiver for help with any other medication adjustments before the pacemaker is replaced. Unless advised otherwise, do not eat or drink after midnight on the night before the procedure EXCEPT for drinking water and taking your medications as you normally would. AFTER THE PROCEDURE   A heart monitor and the pacemaker programmer will be used to make sure that the new pacemaker is working properly.   You can go home  after the procedure.   Your caregiver will advise you if you need to have any stitches. They will be removed 5-7 days after the procedure.  HOME CARE INSTRUCTIONS   Keep the incision clean and dry.   Unless advised otherwise, you may shower after carefully covering the incision with plastic wrap that is taped to your chest.   For the first week after the replacement, avoid stretching motions that pull at the incision site and avoid heavy exercise with the arm on the same side as the incision.   Only take over-the-counter or prescription medicines for pain, discomfort, or fever as directed by your caregiver.   Your caregiver will tell you when you will need to next test your pacemaker by telephone or when to return to the office for re-exam and/or removal of stitches, if necessary.  SEEK MEDICAL CARE IF:   You have unusual pain at the incision site that is not adequately helped by over-the-counter or prescription medicine.   There is drainage or pus from the incision site.   You develop red streaking that extends above or below the incision site.   You feel brief intermittent palpitations, lightheadedness or any symptoms that you feel might be related to your heart.  SEEK IMMEDIATE MEDICAL CARE IF:   You experience chest pain that is different than the pain at the incision site.   You experience:   Shortness of breath.   Palpitations.   Irregular heart beat.   Lightheadedness that does not go away quickly.   Fainting.   You develop a fever.   You have pain that gets worse even though you are taking pain medicine.  MAKE SURE YOU:   Understand these instructions.   Will watch your condition.   Will get help right away if you are not doing well or get worse.  Document Released: 06/08/2006 Document Revised: 02/17/2011 Document Reviewed: 09/11/2006 [employer] Patient Information 2012 [employer]. 

## 2011-08-17 NOTE — CV Procedure (Signed)
Preoperative diagnosis [facility] Postoperative diagnosis same  Procedure: Generator replacement    Following informed consent the patient was brought to the electrophysiology laboratory in place of the fluoroscopic table in the supine position after routine prep and drape lidocaine was infiltrated in the region of the previous incision and carried down to later the device pocket using sharp dissection and electrocautery. The pocket was opened the device was freed up and was explanted.  Interrogation of the previously implanted ventricular lead Medtronic 5076  demonstrated an R wave of  N/A  millivolts., and impedance of  478ohms, and a pacing threshold of 1.0 volts at 0.5 msec.    The previously implanted atrial lead Medtronic 5076 added P-wave amplitude of 2.1 illlivolts  and impedance of  498 ohms, and a pacing threshold of 0.7volts at 0.59milliseconds.  The leads were inspected. The leads were then attached to a Medtronic Adapta  pulse generator, serial number [id number].    The pocket was irrigated with antibiotic containing saline solution hemostasis was assured and the leads and the device were placed in the pocket. The wound is then closed in 3 layers in normal fashion.  The patient tolerated the procedure without apparent complication.  Sherryl Manges

## 2011-08-18 ENCOUNTER — Encounter: Payer: Self-pay | Admitting: *Deleted

## 2011-08-29 ENCOUNTER — Ambulatory Visit: Payer: Medicare Other

## 2011-09-02 ENCOUNTER — Ambulatory Visit (INDEPENDENT_AMBULATORY_CARE_PROVIDER_SITE_OTHER): Payer: Medicare Other | Admitting: *Deleted

## 2011-09-02 ENCOUNTER — Encounter: Payer: Self-pay | Admitting: Internal Medicine

## 2011-09-02 DIAGNOSIS — I442 Atrioventricular block, complete: Secondary | ICD-10-CM

## 2011-09-02 NOTE — Progress Notes (Signed)
Wound check pacer in clinic  

## 2011-09-12 LAB — PACEMAKER DEVICE OBSERVATION
ATRIAL PACING PM: 91
BAMS-0001: 150 {beats}/min
RV LEAD IMPEDENCE PM: 462 Ohm
RV LEAD THRESHOLD: 0.5 V

## 2011-09-21 ENCOUNTER — Other Ambulatory Visit: Payer: Self-pay | Admitting: Family Medicine

## 2011-09-21 MED ORDER — LORAZEPAM 0.5 MG PO TABS: 0.5000 mg | ORAL_TABLET | Freq: Two times a day (BID) | ORAL | Status: DC

## 2011-10-31 ENCOUNTER — Other Ambulatory Visit: Payer: Self-pay | Admitting: Family Medicine

## 2011-10-31 MED ORDER — LORAZEPAM 0.5 MG PO TABS: 0.5000 mg | ORAL_TABLET | Freq: Two times a day (BID) | ORAL | Status: DC

## 2011-11-08 ENCOUNTER — Other Ambulatory Visit: Payer: Self-pay | Admitting: Family Medicine

## 2011-11-10 ENCOUNTER — Other Ambulatory Visit: Payer: Self-pay | Admitting: *Deleted

## 2011-11-10 MED ORDER — SIMVASTATIN 40 MG PO TABS: 40.0000 mg | ORAL_TABLET | Freq: Every evening | ORAL | Status: DC

## 2011-11-10 MED ORDER — METFORMIN HCL 500 MG PO TABS: 500.0000 mg | ORAL_TABLET | Freq: Every day | ORAL | Status: DC

## 2011-11-10 NOTE — Telephone Encounter (Signed)
Refill request received for Metformin and Zocor. Refilled for one month. Patient needs office visit with me. (She has not been seen in 10 months.) Thanks.  Sharetha Newson M. Kurt Hoffmeier, M.D.

## 2011-11-16 ENCOUNTER — Other Ambulatory Visit: Payer: Self-pay | Admitting: Family Medicine

## 2011-11-19 ENCOUNTER — Other Ambulatory Visit: Payer: Self-pay | Admitting: Family Medicine

## 2011-11-22 ENCOUNTER — Encounter: Payer: Self-pay | Admitting: Internal Medicine

## 2011-11-22 ENCOUNTER — Ambulatory Visit (INDEPENDENT_AMBULATORY_CARE_PROVIDER_SITE_OTHER): Payer: Medicare Other | Admitting: Internal Medicine

## 2011-11-22 VITALS — BP 126/63 | HR 63 | Ht 64.0 in | Wt 108.0 lb

## 2011-11-22 DIAGNOSIS — Z950 Presence of cardiac pacemaker: Secondary | ICD-10-CM

## 2011-11-22 DIAGNOSIS — I442 Atrioventricular block, complete: Secondary | ICD-10-CM

## 2011-11-22 DIAGNOSIS — I2510 Atherosclerotic heart disease of native coronary artery without angina pectoris: Secondary | ICD-10-CM

## 2011-11-22 LAB — PACEMAKER DEVICE OBSERVATION
AL THRESHOLD: 0.5 V
BAMS-0001: 150 {beats}/min
RV LEAD THRESHOLD: 1 V
VENTRICULAR PACING PM: 99.9

## 2011-11-22 NOTE — Assessment & Plan Note (Signed)
Without angina continue current meds

## 2011-11-22 NOTE — Assessment & Plan Note (Signed)
She is device dependent without intrinsic P waves poor R-wave.

## 2011-11-22 NOTE — Progress Notes (Signed)
Patient Care Team: Hilarie Fredrickson, MD as PCP - General (Family Medicine)   HPI  Illiana MEAGHEN [name] is a 78 y.o. female this seen in followup following pacemaker generator replacement undertaken for complete heart block.  She also has a history of coronary artery disease with prior bypass surgery.   She denies chest pain or shortness of breath. He is wheelchair-bound.  Past Medical History  Diagnosis Date  . Allergy   . Cancer     breast  . CHF (congestive heart failure)   . Depression   . GERD (gastroesophageal reflux disease)   . Atrioventricular block, complete   . Hyperlipidemia   . Hypertension   . Pacemaker   . Thyroid disease   . Diabetes mellitus   . Anemia   . Bronchitis   . Bipolar disorder   . CAD (coronary artery disease)     prior CABG/ EF 40-45%  . Anxiety   . Dementia     Past Surgical History  Procedure Date  . Breast surgery   . Coronary artery bypass graft   . Mastectomy   . Cholecystectomy   . Above knee leg amputation 12/21/10    right    Current Outpatient Prescriptions  Medication Sig Dispense Refill  . ARIPiprazole (ABILIFY) 15 MG tablet Take 15 mg by mouth daily.      Marland Kitchen aspirin 81 MG EC tablet Take 81 mg by mouth daily.        Marland Kitchen Bioflavonoid Products (ESTER-C) 500-550 MG TABS Take 1 tablet by mouth daily.        Marland Kitchen buPROPion (WELLBUTRIN XL) 150 MG 24 hr tablet Take 300 mg by mouth daily.      Marland Kitchen donepezil (ARICEPT) 10 MG tablet Take 10 mg by mouth at bedtime.      . furosemide (LASIX) 40 MG tablet Take 20 mg by mouth daily.      . furosemide (LASIX) 40 MG tablet TAKE 1/2 TABLET BY MOUTH DAILY  45 tablet  3  . Glucerna (GLUCERNA) LIQD Take 1 Can by mouth 3 (three) times daily between meals.       Marland Kitchen levothyroxine (SYNTHROID, LEVOTHROID) 25 MCG tablet Take 25 mcg by mouth daily.      Marland Kitchen lisinopril (PRINIVIL,ZESTRIL) 10 MG tablet Take 10 mg by mouth daily.      Marland Kitchen LORazepam (ATIVAN) 0.5 MG tablet Take 1 tablet (0.5 mg total) by mouth 2 (two) times  daily.  60 tablet  0  . metFORMIN (GLUCOPHAGE) 500 MG tablet Take 1 tablet (500 mg total) by mouth daily with breakfast.  30 tablet  0  . Multiple Vitamin (MULTIVITAMIN) tablet Take 1 tablet by mouth daily.        . nitroGLYCERIN (NITROSTAT) 0.4 MG SL tablet Place 0.4 mg under the tongue every 5 (five) minutes as needed. For chest pain       . omeprazole (PRILOSEC) 20 MG capsule Take 20 mg by mouth daily.      . polyethylene glycol (MIRALAX / GLYCOLAX) packet Take 17 g by mouth daily as needed. For constipation      . potassium chloride SA (K-DUR,KLOR-CON) 20 MEQ tablet Take 20 mEq by mouth daily.      . propranolol (INDERAL) 10 MG tablet Take 10 mg by mouth 3 (three) times daily.      . sertraline (ZOLOFT) 100 MG tablet Take 1 tablet (100 mg total) by mouth every morning.  90 tablet  1  . simvastatin (ZOCOR) 40  MG tablet Take 1 tablet (40 mg total) by mouth every evening.  30 tablet  0  . sodium chloride 0.9 % injection Inject 3 mLs into the vein every 12 (twelve) hours.  5 mL  0  . trihexyphenidyl (ARTANE) 2 MG tablet Take 2 mg by mouth daily.        Allergies  Allergen Reactions  . Codeine   . Latex   . Morphine     Review of Systems negative except from HPI and PMH  Physical Exam BP 126/63  Pulse 63  Ht 5\' 4"  (1.626 m)  Wt 108 lb (48.988 kg)  BMI 18.54 kg/m2 Well developed and well nourished wheel chair bound in no acute distress HENT normal E scleral and icterus clear Neck Supple JVP flat; carotids brisk and full Clear to ausculation Device pocket well healed; without hematoma or erythema Regular rate and rhythm,  S4 and an early systolic murmur Soft with active bowel sounds No clubbing cyanosis none Edema status post right AKA  Alert and oriented, grossly normal motor and sensory function Skin Warm and Dry    Assessment and  Plan

## 2011-11-22 NOTE — Patient Instructions (Addendum)
Remote monitoring is used to monitor your Pacemaker of ICD from home. This monitoring reduces the number of office visits required to check your device to one time per year. It allows [place] to keep an eye on the functioning of your device to ensure it is working properly. You are scheduled for a device check from home on 02/27/12. You may send your transmission at any time that day. If you have a wireless device, the transmission will be sent automatically. After your physician reviews your transmission, you will receive a postcard with your next transmission date.  Your physician wants you to follow-up in: June 2014 with Dr. Graciela Husbands. You will receive a reminder letter in the mail two months in advance. If you don't receive a letter, please call our office to schedule the follow-up appointment.  Your physician recommends that you continue on your current medications as directed. Please refer to the Current Medication list given to you today.

## 2011-11-22 NOTE — Assessment & Plan Note (Signed)
The patient's device was interrogated.  The information was reviewed. No changes were made in the programming.    

## 2011-12-05 ENCOUNTER — Other Ambulatory Visit: Payer: Self-pay | Admitting: Family Medicine

## 2011-12-05 MED ORDER — LORAZEPAM 0.5 MG PO TABS: 0.5000 mg | ORAL_TABLET | Freq: Two times a day (BID) | ORAL | Status: DC

## 2011-12-06 ENCOUNTER — Ambulatory Visit (INDEPENDENT_AMBULATORY_CARE_PROVIDER_SITE_OTHER): Payer: Medicare Other | Admitting: Family Medicine

## 2011-12-06 ENCOUNTER — Other Ambulatory Visit: Payer: Self-pay | Admitting: Family Medicine

## 2011-12-06 ENCOUNTER — Encounter: Payer: Self-pay | Admitting: Family Medicine

## 2011-12-06 VITALS — BP 151/77 | HR 64 | Temp 99.1°F

## 2011-12-06 DIAGNOSIS — I10 Essential (primary) hypertension: Secondary | ICD-10-CM

## 2011-12-06 DIAGNOSIS — K219 Gastro-esophageal reflux disease without esophagitis: Secondary | ICD-10-CM

## 2011-12-06 DIAGNOSIS — M436 Torticollis: Secondary | ICD-10-CM

## 2011-12-06 DIAGNOSIS — Z23 Encounter for immunization: Secondary | ICD-10-CM

## 2011-12-06 MED ORDER — LEVOTHYROXINE SODIUM 25 MCG PO TABS: 25.0000 ug | ORAL_TABLET | Freq: Every day | ORAL | Status: DC

## 2011-12-06 MED ORDER — LORAZEPAM 0.5 MG PO TABS: 0.5000 mg | ORAL_TABLET | Freq: Two times a day (BID) | ORAL | Status: DC

## 2011-12-06 MED ORDER — METFORMIN HCL 500 MG PO TABS: 500.0000 mg | ORAL_TABLET | Freq: Every day | ORAL | Status: DC

## 2011-12-06 MED ORDER — PROPRANOLOL HCL 10 MG PO TABS: 10.0000 mg | ORAL_TABLET | Freq: Three times a day (TID) | ORAL | Status: DC

## 2011-12-06 MED ORDER — DONEPEZIL HCL 10 MG PO TABS: 10.0000 mg | ORAL_TABLET | Freq: Every day | ORAL | Status: DC

## 2011-12-06 MED ORDER — SERTRALINE HCL 100 MG PO TABS: 100.0000 mg | ORAL_TABLET | Freq: Every day | ORAL | Status: DC

## 2011-12-06 MED ORDER — SIMVASTATIN 40 MG PO TABS: 40.0000 mg | ORAL_TABLET | Freq: Every evening | ORAL | Status: DC

## 2011-12-06 MED ORDER — BUPROPION HCL ER (XL) 150 MG PO TB24: 300.0000 mg | ORAL_TABLET | Freq: Every day | ORAL | Status: DC

## 2011-12-06 MED ORDER — POTASSIUM CHLORIDE CRYS ER 20 MEQ PO TBCR: 20.0000 meq | EXTENDED_RELEASE_TABLET | Freq: Every day | ORAL | Status: DC

## 2011-12-06 NOTE — Patient Instructions (Signed)
It was nice to meet you today. I am glad everything is going well.  I have refilled your medications. Please let me know if you need anything else. Keep a close eye on your blood pressure. If you would like to have home physical therapy, I can order that for you as well.  I will see you back in 4 months for follow up appointment and lab work.  Take care! [name] M. Rolf Fells, M.D.

## 2011-12-06 NOTE — Progress Notes (Signed)
Patient ID: RELDA [name], female   DOB: [birth year], 78 y.o.   MRN: [id number] [name] [name] Pellegrino M. [place] Sigmund, MD Phone: [phone]  @PATID @  Subjective: HPI: Patient is a 78 y.o. female presenting to clinic today for follow up visit. Concerns today include pain.   1. Pain- Patient reports pain this morning in her abdomen and neck. Got better with Aleve.  Never had this before. No gas, no burning in her throat. Daughter states she gave her aleve and the pain resolved and has not returned. No CP, SOB. No HA. No further concerns, per patient 2. Not hold up head- Daughter reports Ms. [name] is starting to let her head fall over to the right side. She is wheelchair bound and does not get much support. She cannot turn her neck, and must turn her whole body to look around. Daughter occasionally puts a firm neck brace on for a day or so then Ms. [name] holds her head straight for a few days when it is removed. She does not have problems eating, drinking or breathing because of this.  3. HTN- Stressed from her children. Sees cardiology on a regular basis, and last BP check in their clinic was at goal. Replaced pacemaker in June of this year. BP has been stable otherwise. Takes all medications daily. Daughter has a BP cuff at home, but does not check it regularly. Denies HA, CP, SOB, and pain. 4. Health maintenance- Flu shot today. UTD on pneumonia shot    History Reviewed: Former smoker quit 20 years ago.  ROS: Please see HPI above.  Objective: Office vital signs reviewed.  Physical Examination:  General: Awake, alert. NAD. In wheelchair with daughter and care giver present in exam room.  HEENT: Atraumatic, normocephalic. Head is obviously tilted down and to the right. She is able to move it, but not much.  Neck: No masses palpated. No LAD. TTP of right side Pulm: CTAB, no wheezes Cardio: RRR, no murmurs appreciated Abdomen:+BS, soft, nontender, nondistended Extremities:  No edema. Right leg surgically absent Neuro: Grossly intact  Assessment: 78 yo F presenting for follow-up appointment  Plan: See Problem List and After Visit Summary

## 2011-12-07 ENCOUNTER — Telehealth: Payer: Self-pay | Admitting: Internal Medicine

## 2011-12-07 DIAGNOSIS — M436 Torticollis: Secondary | ICD-10-CM | POA: Insufficient documentation

## 2011-12-07 NOTE — Telephone Encounter (Signed)
Per daughter pt having chest pain. Blood pressure 183/84. HR 76. Instructed pt to take NTG and follow directions with that. If no relief, call 911. Daughter understands. Explained to daughter pacemaker check would not help with chest pain.

## 2011-12-07 NOTE — Assessment & Plan Note (Signed)
BP elevated today, but previously at goal. Will not make any changes today. Continue Lasix, Lisinopril and Propranolol. Continue to follow-up with cardiology. Will RTC in 3 months for re-check. Will do labs at that time as well.

## 2011-12-07 NOTE — Telephone Encounter (Signed)
Will forward to device clinic  

## 2011-12-07 NOTE — Assessment & Plan Note (Signed)
I think her pain could be related to GERD. Continue all current medications, including small doses of Aleve, if that does help. I do not want her to develop an ulcer from NSAID use.

## 2011-12-07 NOTE — Assessment & Plan Note (Signed)
Obvious head tilt to the right. COuld be secondary to weakness, vs. Osteoporosis. Patient daughter has declined PT at this time, but I think that could help her. Continue to help her straighten her neck as much as possible. Will continue to follow but no intervention at this time.

## 2011-12-07 NOTE — Telephone Encounter (Signed)
plz return call to patient duaghter [phone] regarding questions about device and headache.

## 2012-01-05 ENCOUNTER — Other Ambulatory Visit: Payer: Self-pay | Admitting: Family Medicine

## 2012-02-07 ENCOUNTER — Telehealth: Payer: Self-pay | Admitting: Family Medicine

## 2012-02-07 MED ORDER — LORAZEPAM 0.5 MG PO TABS: 0.5000 mg | ORAL_TABLET | Freq: Two times a day (BID) | ORAL | Status: DC | PRN

## 2012-02-07 NOTE — Telephone Encounter (Signed)
Received refill request in mailbox for lorazepam. This is chronic med for pt last refilled in September. Called pt and discussed refill request format. Will give pt 1wk supply of lorazepam which will be long enough to have PCP address. Pt agreable w/ this plan and will call next week.  Shelly Flatten, MD Family Medicine PGY-2 02/07/2012, 1:34 PM

## 2012-02-27 ENCOUNTER — Encounter: Payer: Medicare Other | Admitting: *Deleted

## 2012-03-02 ENCOUNTER — Other Ambulatory Visit: Payer: Self-pay | Admitting: Family Medicine

## 2012-03-02 MED ORDER — LORAZEPAM 0.5 MG PO TABS: 0.5000 mg | ORAL_TABLET | Freq: Two times a day (BID) | ORAL | Status: DC

## 2012-03-12 ENCOUNTER — Encounter: Payer: Self-pay | Admitting: *Deleted

## 2012-03-20 ENCOUNTER — Ambulatory Visit (INDEPENDENT_AMBULATORY_CARE_PROVIDER_SITE_OTHER): Payer: Medicare Other | Admitting: *Deleted

## 2012-03-20 ENCOUNTER — Encounter: Payer: Self-pay | Admitting: Internal Medicine

## 2012-03-20 DIAGNOSIS — Z950 Presence of cardiac pacemaker: Secondary | ICD-10-CM

## 2012-03-20 DIAGNOSIS — I442 Atrioventricular block, complete: Secondary | ICD-10-CM

## 2012-03-22 LAB — REMOTE PACEMAKER DEVICE
AL THRESHOLD: 0.5 V
BAMS-0001: 150 {beats}/min
BATTERY VOLTAGE: 2.79 V
RV LEAD IMPEDENCE PM: 466 Ohm

## 2012-04-08 ENCOUNTER — Other Ambulatory Visit: Payer: Self-pay | Admitting: Family Medicine

## 2012-04-23 ENCOUNTER — Telehealth: Payer: Self-pay | Admitting: Family Medicine

## 2012-04-23 NOTE — Telephone Encounter (Signed)
Continued Needs Review form to be completed by [place].

## 2012-04-23 NOTE — Telephone Encounter (Signed)
FL-2 form placed in Dr. Algis Downs box for completion.  [name]

## 2012-04-24 NOTE — Telephone Encounter (Signed)
FL-2 form completed.  [name] M. Tally Mckinnon, M.D.

## 2012-04-24 NOTE — Telephone Encounter (Signed)
FL-2 form faxed to [phone].  Ileana Ladd

## 2012-04-30 ENCOUNTER — Ambulatory Visit (INDEPENDENT_AMBULATORY_CARE_PROVIDER_SITE_OTHER): Payer: Medicare Other | Admitting: *Deleted

## 2012-04-30 DIAGNOSIS — I495 Sick sinus syndrome: Secondary | ICD-10-CM

## 2012-04-30 DIAGNOSIS — R0989 Other specified symptoms and signs involving the circulatory and respiratory systems: Secondary | ICD-10-CM

## 2012-04-30 LAB — PACEMAKER DEVICE OBSERVATION

## 2012-04-30 NOTE — Progress Notes (Signed)
Changes to pacer

## 2012-05-14 ENCOUNTER — Encounter: Payer: Self-pay | Admitting: Internal Medicine

## 2012-05-23 ENCOUNTER — Other Ambulatory Visit: Payer: Self-pay

## 2012-05-23 LAB — PACEMAKER DEVICE OBSERVATION
AL IMPEDENCE PM: 698 Ohm
AL THRESHOLD: 0.75 V
RV LEAD AMPLITUDE: 12 mv
RV LEAD IMPEDENCE PM: 439 Ohm
RV LEAD THRESHOLD: 1 V

## 2012-05-28 ENCOUNTER — Other Ambulatory Visit: Payer: Self-pay | Admitting: Family Medicine

## 2012-06-05 ENCOUNTER — Encounter: Payer: Self-pay | Admitting: Internal Medicine

## 2012-06-06 ENCOUNTER — Telehealth: Payer: Self-pay | Admitting: Family Medicine

## 2012-06-06 ENCOUNTER — Other Ambulatory Visit: Payer: Self-pay | Admitting: Family Medicine

## 2012-06-06 MED ORDER — LORAZEPAM 0.5 MG PO TABS: 0.5000 mg | ORAL_TABLET | Freq: Two times a day (BID) | ORAL | Status: DC

## 2012-06-06 NOTE — Telephone Encounter (Signed)
Received refill request from pharmacy for Ativan.  Rx has been printed and caregiver should come to the front desk to get the prescription to take to the pharmacy.  [name] has not been seen in >6 months and will need follow up appointment before any additional refills.  Thanks, [employer]. Atheena Spano, M.D. 06/06/2012 1:52 PM

## 2012-06-06 NOTE — Telephone Encounter (Signed)
LM with female @ [phone] (# given by Mrs. Wenk CNA) to have [employer] (dgt) call back.  Please give the below message. Iveliz Garay, Maryjo Rochester

## 2012-06-11 ENCOUNTER — Other Ambulatory Visit: Payer: Self-pay | Admitting: Family Medicine

## 2012-06-12 ENCOUNTER — Encounter: Payer: Self-pay | Admitting: *Deleted

## 2012-06-12 ENCOUNTER — Other Ambulatory Visit: Payer: Self-pay | Admitting: Family Medicine

## 2012-06-12 NOTE — Telephone Encounter (Signed)
This encounter was created in error - please disregard.

## 2012-07-02 ENCOUNTER — Other Ambulatory Visit: Payer: Self-pay | Admitting: Family Medicine

## 2012-07-03 ENCOUNTER — Ambulatory Visit (INDEPENDENT_AMBULATORY_CARE_PROVIDER_SITE_OTHER): Payer: Medicare Other | Admitting: Cardiovascular Disease

## 2012-07-03 ENCOUNTER — Encounter: Payer: Self-pay | Admitting: Cardiovascular Disease

## 2012-07-03 VITALS — BP 122/80 | HR 60

## 2012-07-03 DIAGNOSIS — E785 Hyperlipidemia, unspecified: Secondary | ICD-10-CM

## 2012-07-03 DIAGNOSIS — I10 Essential (primary) hypertension: Secondary | ICD-10-CM

## 2012-07-03 DIAGNOSIS — I442 Atrioventricular block, complete: Secondary | ICD-10-CM

## 2012-07-03 DIAGNOSIS — I25810 Atherosclerosis of coronary artery bypass graft(s) without angina pectoris: Secondary | ICD-10-CM

## 2012-07-03 NOTE — Assessment & Plan Note (Signed)
Well controlled.  Continue current medications and low sodium Dash type diet.    

## 2012-07-03 NOTE — Progress Notes (Signed)
Patient ID: [name], female   DOB: 08/27/1923, 79 y.o.   MRN: [id number] [name] is seen today for F/U CAD with distant history of CABG. She is wheel chair bound from neurological problems. She has not had any SSCP. Her bypass was in 1994. She has a pacer in for SSS and heart block. Initial implant was in 2005 with syncope. She looks good today with less tremulousness. She has had no dyspnea, pnd,orthopnea or edema. Her pacer has been working well and she is not pacer dependant Dr. Isaias Cowman her primary would like to simplify her meds and I think this would be fine. She has a full time aid "Porshe" who was with her today. Echo 2012 reviewed and EF 40-45% with mild MR and severe LVH.  Needs to F/U with [facility] about pacer.  Generator changes 7/13     Does not need to see me in F/U  Advanced Altzheimers. Recent AKA on right side from osteomyelitis  ROS: Denies fever, malais, weight loss, blurry vision, decreased visual acuity, cough, sputum, SOB, hemoptysis, pleuritic pain, palpitaitons, heartburn, abdominal pain, melena, lower extremity edema, claudication, or rash.  All other systems reviewed and negative  General: Affect appropriate Chronically ill female in wheel chair HEENT: normal Neck supple with no adenopathy JVP normal no bruits no thyromegaly Lungs clear with no wheezing and good diaphragmatic motion Heart:  S1/S2 SEM murmur, no rub, gallop or click PMI normal Abdomen: benighn, BS positve, no tenderness, no AAA no bruit.  No HSM or HJR Distal pulses intact  On left with R UKA No edema Neuro tremors and limited motion in UE;s Skin warm and dry No muscular weakness   Current Outpatient Prescriptions  Medication Sig Dispense Refill  . ARIPiprazole (ABILIFY) 15 MG tablet Take 15 mg by mouth daily.      Marland Kitchen aspirin 81 MG EC tablet Take 81 mg by mouth daily.        Marland Kitchen Bioflavonoid Products (ESTER-C) 500-550 MG TABS Take 1 tablet by mouth daily.        Marland Kitchen buPROPion (WELLBUTRIN XL) 150 MG 24 hr  tablet Take 2 tablets (300 mg total) by mouth daily.  180 tablet  2  . donepezil (ARICEPT) 10 MG tablet Take 1 tablet (10 mg total) by mouth at bedtime.  90 tablet  2  . furosemide (LASIX) 40 MG tablet Take 20 mg by mouth daily.      Marland Kitchen Glucerna (GLUCERNA) LIQD Take 1 Can by mouth 3 (three) times daily between meals.       Marland Kitchen levothyroxine (SYNTHROID, LEVOTHROID) 25 MCG tablet Take 1 tablet (25 mcg total) by mouth daily.  90 tablet  2  . LORazepam (ATIVAN) 0.5 MG tablet Take 1 tablet (0.5 mg total) by mouth 2 (two) times daily.  60 tablet  1  . metFORMIN (GLUCOPHAGE) 500 MG tablet Take 1 tablet (500 mg total) by mouth daily with breakfast.  90 tablet  2  . Multiple Vitamin (MULTIVITAMIN) tablet Take 1 tablet by mouth daily.        . nitroGLYCERIN (NITROSTAT) 0.4 MG SL tablet Place 0.4 mg under the tongue every 5 (five) minutes as needed. For chest pain       . omeprazole (PRILOSEC) 20 MG capsule Take 20 mg by mouth daily.      . polyethylene glycol powder (GLYCOLAX/MIRALAX) powder TAKE 17 GRAMS DAILY BY MOUTH AS NEEDED  527 g  0  . potassium chloride SA (KLOR-CON M20) 20 MEQ tablet Take  1 tablet (20 mEq total) by mouth daily.  45 tablet  3  . propranolol (INDERAL) 10 MG tablet TAKE 1 TABLET (10 MG TOTAL) BY MOUTH 3 (THREE) TIMES DAILY.  90 tablet  5  . propranolol (INDERAL) 10 MG tablet TAKE 1 TABLET (10 MG TOTAL) BY MOUTH 3 (THREE) TIMES DAILY.  90 tablet  5  . sertraline (ZOLOFT) 100 MG tablet Take 1 tablet (100 mg total) by mouth daily.  90 tablet  2  . simvastatin (ZOCOR) 40 MG tablet Take 1 tablet (40 mg total) by mouth every evening.  90 tablet  2  . trihexyphenidyl (ARTANE) 2 MG tablet Take 2 mg by mouth daily.      Marland Kitchen lisinopril (PRINIVIL,ZESTRIL) 10 MG tablet TAKE 1 TABLET BY MOUTH DAILY  90 tablet  3   No current facility-administered medications for this visit.    Allergies  Codeine; Latex; and Morphine  Electrocardiogram:  AV pacing rate 60    Assessment and Plan

## 2012-07-03 NOTE — Assessment & Plan Note (Signed)
Stable with no angina and good activity level.  Continue medical Rx  

## 2012-07-03 NOTE — Assessment & Plan Note (Signed)
Generator change 6/13  Normal function ECG ok  F/U Dr Graciela Husbands in June

## 2012-07-03 NOTE — Assessment & Plan Note (Signed)
Cholesterol is at goal.  Continue current dose of statin and diet Rx.  No myalgias or side effects.  F/U  LFT's in 6 months. No results found for this basename: LDLCALC             

## 2012-07-03 NOTE — Patient Instructions (Addendum)
Your physician recommends that you schedule a follow-up appointment in: FOLLOW UP  WITH DR Graciela Husbands  AS SCHEDULED  Your physician recommends that you continue on your current medications as directed. Please refer to the Current Medication list given to you today.

## 2012-07-23 ENCOUNTER — Other Ambulatory Visit: Payer: Self-pay | Admitting: Family Medicine

## 2012-08-10 ENCOUNTER — Encounter: Payer: Self-pay | Admitting: Family Medicine

## 2012-08-10 ENCOUNTER — Ambulatory Visit (INDEPENDENT_AMBULATORY_CARE_PROVIDER_SITE_OTHER): Payer: Medicare Other | Admitting: Family Medicine

## 2012-08-10 VITALS — BP 122/57 | HR 65 | Temp 98.6°F | Ht 64.5 in | Wt 121.0 lb

## 2012-08-10 DIAGNOSIS — M79676 Pain in unspecified toe(s): Secondary | ICD-10-CM | POA: Insufficient documentation

## 2012-08-10 DIAGNOSIS — G479 Sleep disorder, unspecified: Secondary | ICD-10-CM

## 2012-08-10 DIAGNOSIS — M79609 Pain in unspecified limb: Secondary | ICD-10-CM

## 2012-08-10 DIAGNOSIS — I10 Essential (primary) hypertension: Secondary | ICD-10-CM

## 2012-08-10 DIAGNOSIS — M79675 Pain in left toe(s): Secondary | ICD-10-CM

## 2012-08-10 DIAGNOSIS — E079 Disorder of thyroid, unspecified: Secondary | ICD-10-CM

## 2012-08-10 DIAGNOSIS — E119 Type 2 diabetes mellitus without complications: Secondary | ICD-10-CM

## 2012-08-10 LAB — BASIC METABOLIC PANEL
BUN: 35 mg/dL — ABNORMAL HIGH (ref 6–23)
Potassium: 5.3 mEq/L (ref 3.5–5.3)
Sodium: 139 mEq/L (ref 135–145)

## 2012-08-10 LAB — TSH: TSH: 1.856 u[IU]/mL (ref 0.350–4.500)

## 2012-08-10 LAB — POCT GLYCOSYLATED HEMOGLOBIN (HGB A1C): Hemoglobin A1C: 5.5

## 2012-08-10 MED ORDER — SERTRALINE HCL 100 MG PO TABS: 100.0000 mg | ORAL_TABLET | Freq: Every day | ORAL | Status: AC

## 2012-08-10 MED ORDER — ARIPIPRAZOLE 15 MG PO TABS: 15.0000 mg | ORAL_TABLET | Freq: Every day | ORAL | Status: AC

## 2012-08-10 MED ORDER — METFORMIN HCL 500 MG PO TABS: 500.0000 mg | ORAL_TABLET | Freq: Every day | ORAL | Status: AC

## 2012-08-10 MED ORDER — BUPROPION HCL ER (XL) 150 MG PO TB24: 300.0000 mg | ORAL_TABLET | Freq: Every day | ORAL | Status: AC

## 2012-08-10 MED ORDER — LORAZEPAM 0.5 MG PO TABS: 0.5000 mg | ORAL_TABLET | Freq: Two times a day (BID) | ORAL | Status: DC

## 2012-08-10 NOTE — Assessment & Plan Note (Signed)
I think her sleep is due to her going to bed before she is ready to fall asleep. Explained to patient why I do not want her on any type of medication for sleep, but rather to work on sleep hygiene. Go to bed later, avoid drinking anything before bed, and only go to sleep when she is truly sleepy. Pt and family agrees.

## 2012-08-10 NOTE — Assessment & Plan Note (Signed)
Pt has refused to have toenail removed in the past, but it is now causing irritation. Since she is not going to have it removed, I have asked family to keep a bandage over the second toe to prevent an open wound.

## 2012-08-10 NOTE — Progress Notes (Signed)
Patient ID: [name], female   DOB: 10-04-1923, 79 y.o.   MRN: [id number]  [name] Pallas Wahlert M. Ashyla Luth, MD Phone: [phone]   Subjective: HPI: Patient is a 79 y.o. female presenting to clinic today for follow up appointment. Brought in by daughter and caregiver. Concerns today include toenail problem and not sleeping.  1. Sleep- Goes to bed at 7:30 gets up at 5:00. Wakes up 2-3 times each night but does not get up. Just lays in bed unless she needs to use the bathroom. No TV. No caffeine except for breakfast. She falls asleep in the morning. She is supposed to take a 2 hour nap but she does not sleep well. Does not fall asleep easily. Daughter states if she stays awake later she sleeps better.   2. Toenail- Has a thickened toenail on great toe which is rubbing her second toe. She has been filing her toenail which makes it rough.   3. Hypertension- Blood pressure at home: Always good Blood pressure today:  122/57 Taking Meds: Yes, no missed doses. Is followed by cardiology as well Side effects: None ROS: Denies headache, visual changes, nausea, vomiting, chest pain, abdominal pain or shortness of breath.  4. Diabetes- High at home: 160 Low at home: 122 Taking medications: Yes Side effects: None ROS: denies fever, chills, dizziness, LOC, polyuria, polydipsia, numbness or tingling in extremities or chest pain. Last eye exam: Patient declines Last foot exam: Today Nephropathy screen indicated?: Yes, needs labs  History Reviewed: Former smoker. Health Maintenance: Needs labs  ROS: Please see HPI above.  Objective: Office vital signs reviewed. BP 122/57  Pulse 65  Temp(Src) 98.6 F (37 C) (Oral)  Ht 5' 4.5" (1.638 m)  Wt 121 lb (54.885 kg)  BMI 20.46 kg/m2  Physical Examination:  General: Awake, alert. NAD. Sitting in wheelchair. Slightly slumped to the right HEENT: Atraumatic, normocephalic. Wearing makeup Pulm: CTAB, no wheezes Cardio:  RRR, pacemaker on left chest Abdomen:+BS, soft, nontender, nondistended Extremities: Right leg surgically absent. No edema of left. Great left toenail very thickened and discolored. Starting to rub ulcer on second toe. Small scab on bottom of foot without surrounding erythema Neuro: Grossly intact  Assessment: 79 y.o. female follow up appointment.  Plan: See Problem List and After Visit Summary

## 2012-08-10 NOTE — Patient Instructions (Signed)
It was good to see you today. I am glad everything is going well.  Try to stay up later at night.   I will call you with any abnormal labs.  I will see you in the fall for check up and flu shot.  Kirat Mezquita M. Shaindy Reader, M.D.

## 2012-08-10 NOTE — Assessment & Plan Note (Signed)
On Metformin daily. A1C well controlled. Continue for now since she is stable, but consider d/c at next visit.

## 2012-08-10 NOTE — Assessment & Plan Note (Signed)
BP at goal. Continue medications. Will check labs today including Bmet, CBC, Tsh.

## 2012-08-11 LAB — CBC
MCHC: 33.6 g/dL (ref 30.0–36.0)
Platelets: 260 10*3/uL (ref 150–400)
RDW: 14 % (ref 11.5–15.5)

## 2012-08-13 ENCOUNTER — Encounter: Payer: Self-pay | Admitting: Family Medicine

## 2012-09-03 ENCOUNTER — Telehealth: Payer: Self-pay | Admitting: Family Medicine

## 2012-09-03 ENCOUNTER — Ambulatory Visit (INDEPENDENT_AMBULATORY_CARE_PROVIDER_SITE_OTHER): Payer: Medicare Other | Admitting: Internal Medicine

## 2012-09-03 ENCOUNTER — Encounter: Payer: Self-pay | Admitting: Internal Medicine

## 2012-09-03 VITALS — BP 129/56 | HR 60 | Ht 64.0 in | Wt 122.0 lb

## 2012-09-03 DIAGNOSIS — Z950 Presence of cardiac pacemaker: Secondary | ICD-10-CM

## 2012-09-03 DIAGNOSIS — I442 Atrioventricular block, complete: Secondary | ICD-10-CM

## 2012-09-03 LAB — PACEMAKER DEVICE OBSERVATION
AL THRESHOLD: 0.5 V
BATTERY VOLTAGE: 2.79 V
RV LEAD AMPLITUDE: 11.2 mv

## 2012-09-03 NOTE — Telephone Encounter (Signed)
Patient stopped by and is needing her lancets refilled.

## 2012-09-03 NOTE — Patient Instructions (Signed)

## 2012-09-03 NOTE — Assessment & Plan Note (Signed)
The patient's device was interrogated.  The information was reviewed. No changes were made in the programming.    

## 2012-09-03 NOTE — Assessment & Plan Note (Signed)
Stable post pacing 

## 2012-09-03 NOTE — Progress Notes (Addendum)
Patient Care Team: Hilarie Fredrickson, MD as PCP - General (Family Medicine)   HPI  [name] is a 79 y.o. female this seen in followup following pacemaker generator replacement undertaken for complete heart block. She also has a history of coronary artery disease with prior bypass surgery.  She denies chest pain or shortness of breath. She is wheelchair-bound.   She had an episode over the weekend after she transferred from the commode to her bed where she became presyncopal. This was anomaly   Past Medical History  Diagnosis Date  . Allergy   . Cancer     breast  . CHF (congestive heart failure)   . Depression   . GERD (gastroesophageal reflux disease)   . Atrioventricular block, complete   . Hyperlipidemia   . Hypertension   . Pacemaker   . Thyroid disease   . Diabetes mellitus   . Anemia   . Bronchitis   . Bipolar disorder   . CAD (coronary artery disease)     prior CABG/ EF 40-45%  . Anxiety   . Dementia     Past Surgical History  Procedure Laterality Date  . Breast surgery    . Coronary artery bypass graft    . Mastectomy    . Cholecystectomy    . 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 ARIPiprazole (ABILIFY) 15 MG tablet Take 1 tablet (15 mg total) by mouth daily.  90 tablet  3  . aspirin 81 MG EC tablet Take 81 mg by mouth daily.        Marland Kitchen Bioflavonoid Products (ESTER-C) 500-550 MG TABS Take 1 tablet by mouth daily.        Marland Kitchen buPROPion (WELLBUTRIN XL) 150 MG 24 hr tablet Take 2 tablets (300 mg total) by mouth daily.  180 tablet  3  . donepezil (ARICEPT) 10 MG tablet Take 1 tablet (10 mg total) by mouth at bedtime.  90 tablet  2  . furosemide (LASIX) 40 MG tablet Take 20 mg by mouth daily.      Marland Kitchen Glucerna (GLUCERNA) LIQD Take 1 Can by mouth 3 (three) times daily between meals.       Marland Kitchen KLOR-CON M20 20 MEQ tablet TAKE 1 TABLET (20 MEQ TOTAL) BY MOUTH DAILY.  45 tablet  3  .  levothyroxine (SYNTHROID, LEVOTHROID) 25 MCG tablet Take 1 tablet (25 mcg total) by mouth daily.  90 tablet  2  . lisinopril (PRINIVIL,ZESTRIL) 10 MG tablet TAKE 1 TABLET BY MOUTH DAILY  90 tablet  3  . LORazepam (ATIVAN) 0.5 MG tablet Take 1 tablet (0.5 mg total) by mouth 2 (two) times daily.  60 tablet  2  . metFORMIN (GLUCOPHAGE) 500 MG tablet Take 1 tablet (500 mg total) by mouth daily with breakfast.  90 tablet  3  . Multiple Vitamin (MULTIVITAMIN) tablet Take 1 tablet by mouth daily.        . nitroGLYCERIN (NITROSTAT) 0.4 MG SL tablet Place 0.4 mg under the tongue every 5 (five) minutes as needed. For chest pain       . omeprazole (PRILOSEC) 20 MG capsule Take 20 mg by mouth daily.      . polyethylene glycol powder (GLYCOLAX/MIRALAX) powder TAKE 17 GRAMS DAILY BY MOUTH AS NEEDED  527 g  0  . propranolol (INDERAL) 10 MG tablet TAKE 1 TABLET (10 MG TOTAL) BY MOUTH 3 (THREE) TIMES  DAILY.  90 tablet  5  . propranolol (INDERAL) 10 MG tablet TAKE 1 TABLET (10 MG TOTAL) BY MOUTH 3 (THREE) TIMES DAILY.  90 tablet  5  . sertraline (ZOLOFT) 100 MG tablet Take 1 tablet (100 mg total) by mouth daily.  90 tablet  3  . simvastatin (ZOCOR) 40 MG tablet Take 1 tablet (40 mg total) by mouth every evening.  90 tablet  2  . trihexyphenidyl (ARTANE) 2 MG tablet Take 2 mg by mouth daily.       No current facility-administered medications for this visit.    Allergies  Allergen Reactions  . Codeine   . Latex   . Morphine     Review of Systems negative except from HPI and PMH  Physical Exam BP 129/56  Pulse 60  Ht 5\' 4"  (1.626 m)  Wt 122 lb (55.339 kg)  BMI 20.93 kg/m2 Well developed and nourished in no acute distress HENT normal Neck supple with JVP-flat Clear Regular rate and rhythm, no murmurs or gallops Abd-soft with active BS No Clubbing cyanosis edema Skin-warm and dry A & Oriented  Grossly normal sensory and motor function  ecg AVpacing   Assessment and  Plan

## 2012-09-04 ENCOUNTER — Encounter: Payer: Medicare Other | Admitting: Internal Medicine

## 2012-09-04 NOTE — Telephone Encounter (Signed)
We do not have her type of lancets in the med list. Her daughter had mentioned getting these through a mail-in company. Can you please find out more information about her type of meter and where to have these filled?  Thanks, [employer]. Antonie Borjon, M.D.

## 2012-09-04 NOTE — Telephone Encounter (Signed)
[facility]  [phone] supply the lancets.

## 2012-09-04 NOTE — Telephone Encounter (Signed)
LM with Diane's (pt daughter) husband to have diane give [place] a call back.  Please find out what lancets she uses. Kerrilynn Derenzo, Maryjo [place]

## 2012-09-05 ENCOUNTER — Other Ambulatory Visit: Payer: Self-pay | Admitting: Family Medicine

## 2012-09-05 NOTE — Telephone Encounter (Signed)
Contacted [facility].  They only use aviva 30g lancets. Changed pharmacy in patients chart, but not sure how to put in the lancets to order them. [place], Maryjo [place]

## 2012-09-06 MED ORDER — LANCETS 30G MISC: 1.0000 | Freq: Three times a day (TID) | Status: AC

## 2012-09-06 NOTE — Telephone Encounter (Signed)
Lancets sent to [facility] #100/11refills. Diane should expect to hear directly from company if there is a problem.  Thanks, [employer]. Jonia Oakey, M.D.

## 2012-09-26 ENCOUNTER — Other Ambulatory Visit: Payer: Self-pay | Admitting: Family Medicine

## 2012-10-09 ENCOUNTER — Other Ambulatory Visit: Payer: Self-pay | Admitting: *Deleted

## 2012-10-10 MED ORDER — LORAZEPAM 0.5 MG PO TABS: 0.5000 mg | ORAL_TABLET | Freq: Two times a day (BID) | ORAL | Status: DC

## 2012-10-22 ENCOUNTER — Telehealth: Payer: Self-pay | Admitting: Family Medicine

## 2012-10-22 NOTE — Telephone Encounter (Signed)
Have patient contact the pharmacy for a refill request. I think this has already been done in the past, but daughter should request it directly from them.  Thanks, [employer]. Skyrah Krupp, M.D.

## 2012-10-22 NOTE — Telephone Encounter (Signed)
Daughter is requesting all diabetic supplies be refill at the new pharmacy [facility]. JW

## 2012-10-22 NOTE — Telephone Encounter (Signed)
Will forward to MD. Jazmin Hartsell,CMA  

## 2012-11-23 ENCOUNTER — Other Ambulatory Visit: Payer: Self-pay | Admitting: Family Medicine

## 2012-12-03 ENCOUNTER — Ambulatory Visit (INDEPENDENT_AMBULATORY_CARE_PROVIDER_SITE_OTHER): Payer: Medicare Other | Admitting: *Deleted

## 2012-12-03 DIAGNOSIS — I442 Atrioventricular block, complete: Secondary | ICD-10-CM

## 2012-12-05 ENCOUNTER — Encounter: Payer: Self-pay | Admitting: Internal Medicine

## 2012-12-08 LAB — REMOTE PACEMAKER DEVICE
ATRIAL PACING PM: 85
BAMS-0001: 150 {beats}/min
BATTERY VOLTAGE: 2.79 V
RV LEAD IMPEDENCE PM: 453 Ohm

## 2012-12-09 ENCOUNTER — Other Ambulatory Visit: Payer: Self-pay | Admitting: Family Medicine

## 2012-12-17 ENCOUNTER — Encounter: Payer: Self-pay | Admitting: *Deleted

## 2013-01-16 ENCOUNTER — Other Ambulatory Visit: Payer: Self-pay | Admitting: Family Medicine

## 2013-01-17 ENCOUNTER — Other Ambulatory Visit: Payer: Self-pay | Admitting: Family Medicine

## 2013-01-17 MED ORDER — LORAZEPAM 0.5 MG PO TABS: 0.5000 mg | ORAL_TABLET | Freq: Two times a day (BID) | ORAL | Status: AC

## 2013-01-28 ENCOUNTER — Other Ambulatory Visit: Payer: Self-pay | Admitting: Family Medicine

## 2013-01-29 ENCOUNTER — Other Ambulatory Visit: Payer: Self-pay | Admitting: Family Medicine

## 2013-02-12 ENCOUNTER — Other Ambulatory Visit: Payer: Self-pay | Admitting: Family Medicine

## 2013-02-25 ENCOUNTER — Other Ambulatory Visit: Payer: Self-pay | Admitting: Family Medicine

## 2013-03-03 IMAGING — CT CT FOOT*R* W/CM
3 of 6 series · 8 of 20 positions shown, 9 images · IV contrast (omnipaque)
Comparison: None.

CLINICAL DATA: Cellulitis.  Osteomyelitis.

CT OF THE RIGHT FOOT WITH CONTRAST
TECHNIQUE: Multidetector CT imaging was performed following the
standard protocol during bolus administration of intravenous
contrast.
Contrast: 100mL OMNIPAQUE IOHEXOL 300 MG/ML IV SOLN

[Series 2: lower ext · axial · 0.49mm/px · z∈[-180,-122]mm · 2 of 71 slices shown]
[im 24/71  bone]
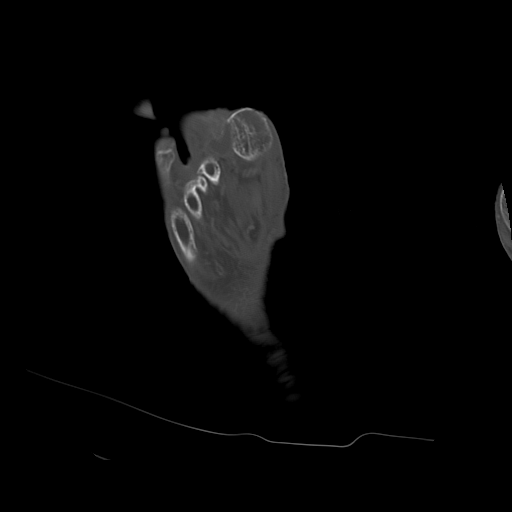
[im 47/71  bone]
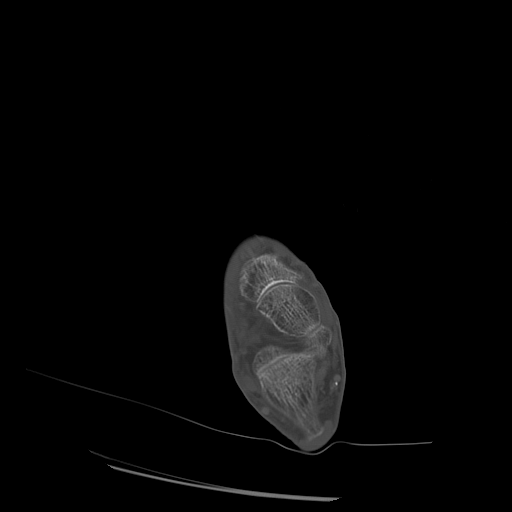

[Series 401: axial st · coronal · 0.49mm/px · 3 of 104 slices shown]
[im 28/104  bone]
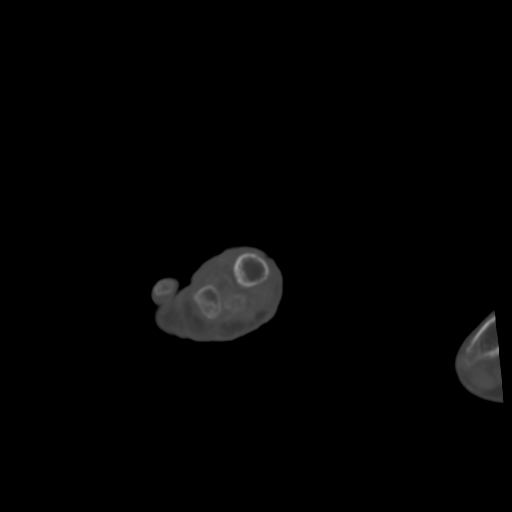
[im 44/104  bone]
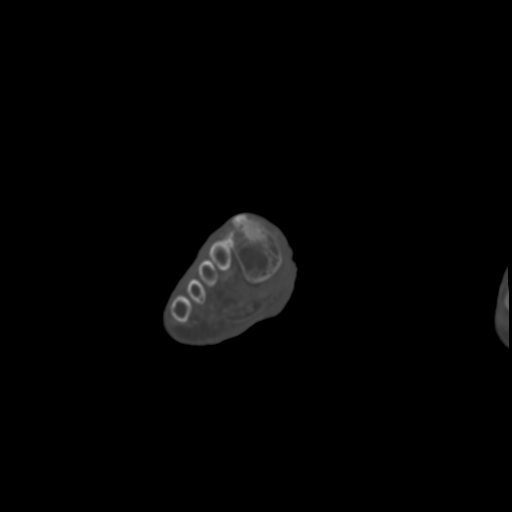
[im 60/104  bone]
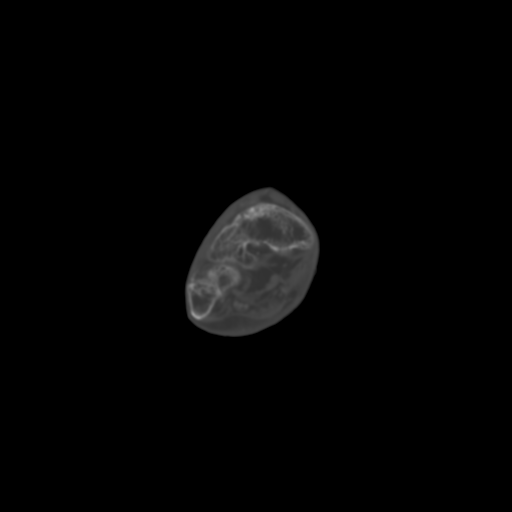

[Series 402: coronal st · axial · 0.49mm/px · z∈[-284,-26]mm · 3 of 68 slices shown, 4 images]
[im 1/68  soft-tissue]
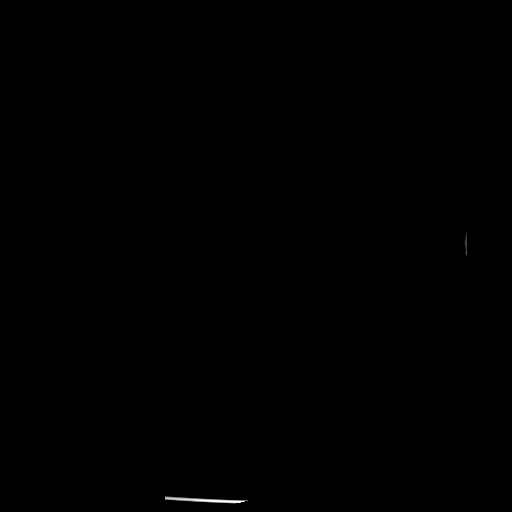
[im 1/68  bone]
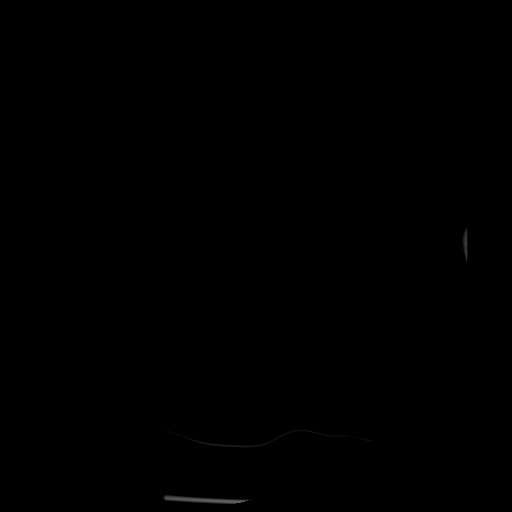
[im 34/68  bone]
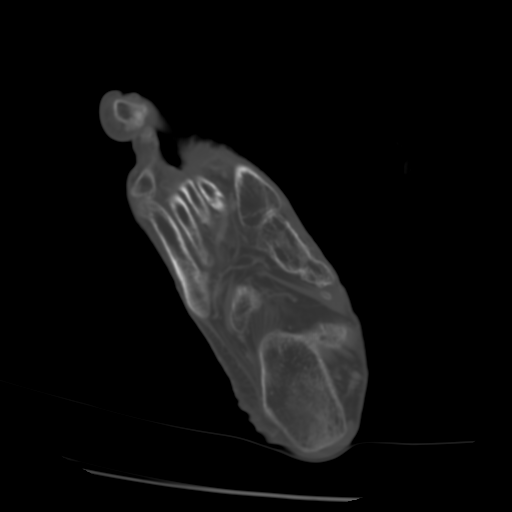
[im 68/68  bone]
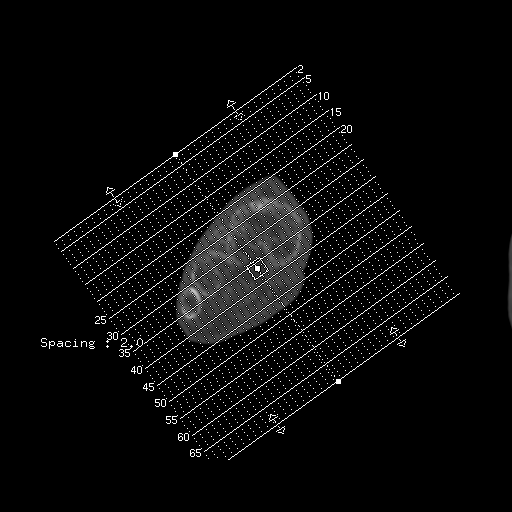

[8 of 20 positions shown; findings below may reference images not displayed]

FINDINGS: Diffuse osteopenia is present.  Hallux valgus is present
with overlap of the great toe.   There is soft tissue infiltration
over the dorsum of the foot, compatible with cellulitis in the
appropriate clinical setting.  Vascular clips are present in the
distal medial leg.  Atrophy of the plantar foot musculature.  There
is no definite cortical erosion or periosteal reaction to suggest
osteomyelitis.  There is irregularity of the skin along the medial
aspect of the midfoot suggesting ulceration.  No soft tissue
abscess is identified.  Small vessel atherosclerosis is present.
Ankylosis of the midfoot bones is present (image 19 series 300
IMPRESSION: Severe diffuse osteopenia with probable medial mid foot ulceration.
No osteolysis or cortical erosion to suggest osteomyelitis.
Infiltration of the subcutaneous tissues compatible with cellulitis
in the appropriate clinical setting.  Midfoot ankylosis.

## 2013-03-04 ENCOUNTER — Encounter: Payer: Medicare Other | Admitting: *Deleted

## 2013-03-11 ENCOUNTER — Other Ambulatory Visit: Payer: Self-pay | Admitting: Family Medicine

## 2013-03-11 MED ORDER — OMEPRAZOLE 20 MG PO CPDR: 20.0000 mg | DELAYED_RELEASE_CAPSULE | Freq: Every day | ORAL | Status: AC

## 2013-03-19 ENCOUNTER — Other Ambulatory Visit: Payer: Self-pay | Admitting: Family Medicine

## 2013-03-21 ENCOUNTER — Encounter: Payer: Self-pay | Admitting: *Deleted

## 2013-04-15 ENCOUNTER — Encounter: Payer: Self-pay | Admitting: Internal Medicine

## 2013-04-15 ENCOUNTER — Ambulatory Visit (INDEPENDENT_AMBULATORY_CARE_PROVIDER_SITE_OTHER): Payer: Medicare Other | Admitting: *Deleted

## 2013-04-15 DIAGNOSIS — I442 Atrioventricular block, complete: Secondary | ICD-10-CM

## 2013-05-01 LAB — MDC_IDC_ENUM_SESS_TYPE_REMOTE
Brady Statistic AP VP Percent: 84.6 %
Brady Statistic AS VP Percent: 15.4 %
Brady Statistic AS VS Percent: 0.1 %
Lead Channel Impedance Value: 465 Ohm
Lead Channel Pacing Threshold Amplitude: 0.5 V
Lead Channel Pacing Threshold Amplitude: 0.5 V
Lead Channel Setting Pacing Amplitude: 2 V
Lead Channel Setting Pacing Amplitude: 2.5 V
Lead Channel Setting Sensing Sensitivity: 4 mV
MDC IDC MSMT LEADCHNL RA IMPEDANCE VALUE: 431 Ohm
MDC IDC MSMT LEADCHNL RA PACING THRESHOLD PULSEWIDTH: 0.4 ms
MDC IDC MSMT LEADCHNL RV PACING THRESHOLD PULSEWIDTH: 0.4 ms
MDC IDC SET LEADCHNL RV PACING PULSEWIDTH: 0.4 ms
MDC IDC STAT BRADY AP VS PERCENT: 0.1 %

## 2013-05-06 ENCOUNTER — Telehealth: Payer: Self-pay | Admitting: *Deleted

## 2013-05-06 NOTE — Telephone Encounter (Signed)
Prior Authorization received from [facility] pharmacy for TRIHEXYPHENIDYLY 2 mg tab. Formulary and PA form placed in provider box for completion. Derl Barrow, RN

## 2013-05-08 NOTE — Telephone Encounter (Signed)
PA completed but I am not sure what she has been on other than this medication.  This is continuing a chronic medication for patient. I will speak with them directly, if needed.  Thank you, Amber M. Hairford, M.D.

## 2013-05-08 NOTE — Telephone Encounter (Signed)
PA form  For trihexyphenidyl 2 mg tab faxed to [place] for review.  Derl Barrow, RN

## 2013-05-13 ENCOUNTER — Telehealth: Payer: Self-pay | Admitting: *Deleted

## 2013-05-13 NOTE — Telephone Encounter (Signed)
PA for Trihexyphenidyl 2 mg tab has been declined.  Medication is considered high risk for pt over age 78.  Denial placed in provider box for review. Derl Barrow, RN

## 2013-05-20 ENCOUNTER — Other Ambulatory Visit: Payer: Self-pay | Admitting: Internal Medicine

## 2013-05-20 ENCOUNTER — Telehealth: Payer: Self-pay | Admitting: Internal Medicine

## 2013-05-20 ENCOUNTER — Telehealth: Payer: Self-pay | Admitting: *Deleted

## 2013-05-20 LAB — MDC_IDC_ENUM_SESS_TYPE_REMOTE
Battery Impedance: 196 Ohm
Battery Remaining Longevity: 97 mo
Battery Voltage: 2.79 V
Brady Statistic AP VP Percent: 82 %
Lead Channel Impedance Value: 432 Ohm
Lead Channel Pacing Threshold Amplitude: 0.625 V
Lead Channel Pacing Threshold Pulse Width: 0.4 ms
Lead Channel Pacing Threshold Pulse Width: 0.4 ms
Lead Channel Setting Pacing Amplitude: 2 V
Lead Channel Setting Pacing Pulse Width: 0.4 ms
MDC IDC MSMT LEADCHNL RA PACING THRESHOLD AMPLITUDE: 0.375 V
MDC IDC MSMT LEADCHNL RA SENSING INTR AMPL: 2.8 mV
MDC IDC MSMT LEADCHNL RV IMPEDANCE VALUE: 473 Ohm
MDC IDC SESS DTM: 20150309133142
MDC IDC SET LEADCHNL RV PACING AMPLITUDE: 2.5 V
MDC IDC SET LEADCHNL RV SENSING SENSITIVITY: 4 mV
MDC IDC STAT BRADY AP VS PERCENT: 0 %
MDC IDC STAT BRADY AS VP PERCENT: 18 %
MDC IDC STAT BRADY AS VS PERCENT: 0 %

## 2013-05-20 NOTE — Telephone Encounter (Signed)
Daughter // Diane/ POA and caretaker,  states her mom has been having CP off and on for 1 month/ nitro used x 1 and pain has resolved currently. Pt has nausea with CP and remains SOB. Per daughter// she has a hard time completing a sentence due to SOB. Pt states "I'm dying" I want to stay home. Pt declines any hospital care and per daughter she is very difficult to bring to the office// pt WC bound, has cancer that has mets, she states that the cancer has grown and is pushing on organs. They would like Hospice care but her physician is out of town and they don't know what to do to get help. I spoke with Dr Johnsie Cancel and he will sign orders to bring hospice in/ [facility] to [facility] care.  Daughter was appreciative and accepting of recommendations.

## 2013-05-20 NOTE — Telephone Encounter (Signed)
New message         Pt is having active chest pain and had to take a nitro

## 2013-05-20 NOTE — Telephone Encounter (Signed)
Received call from pt's daughter [name] Patient this AM needing hospice for her mom.  Mrs. [name] stated pt was diagnosed 5 years ago with ovarian cancer.  Pt is having a very bad body odor, pain in back, ribs and vaginal area, coughing up brownish mucous, trouble breathing and very weak according to daughter.  Mrs. [name] stated she was the caretaker and Power of Attorney of the pt.  Pt was refusing to return to hospital or doctor's office for evaluation per Mrs. [name].  Precepted with Dr.Hensel, pt needs an appt with PCP since pt has not been seen in clinic in almost one year.  Offered an appt with PCP, but declined stated she would call pt's cardiologist for an hospice order.  Derl Barrow, RN

## 2013-05-21 NOTE — Telephone Encounter (Signed)
New Message  [facility] with [facility] and Hospice called. She states the pt was admitted.. Will receive hospice services.. req a call back discuss medications. Please assist.

## 2013-05-21 NOTE — Telephone Encounter (Signed)
If she is hospitalized primary service and primary care doctors should handle hospice services

## 2013-05-22 ENCOUNTER — Telehealth: Payer: Self-pay | Admitting: Family Medicine

## 2013-05-22 NOTE — Telephone Encounter (Signed)
[name] with [facility] at [facility] would like a list of pts current medications Please fax to [phone] attn susan

## 2013-05-22 NOTE — Telephone Encounter (Signed)
Follow up     Geni Bers calling back from [facility]  Speak with Altha Harm.

## 2013-05-22 NOTE — Telephone Encounter (Signed)
Geni Bers aware that now that pt is hospitalized primary services and primary care doctors should handle hospice services. Geni Bers verbalized understanding.

## 2013-05-23 NOTE — Telephone Encounter (Signed)
List of medications faxed to [facility]. Dervin Vore,CMA

## 2013-05-24 ENCOUNTER — Ambulatory Visit: Payer: Medicare Other | Admitting: Family Medicine

## 2013-06-03 ENCOUNTER — Encounter: Payer: Self-pay | Admitting: *Deleted

## 2013-06-06 ENCOUNTER — Telehealth: Payer: Self-pay | Admitting: Family Medicine

## 2013-06-11 ENCOUNTER — Telehealth: Payer: Self-pay

## 2013-06-11 NOTE — Telephone Encounter (Signed)
Patient past away per [facility]  °

## 2013-06-12 NOTE — Telephone Encounter (Signed)
Calling to inform that [name] passed away this am 2:50.

## 2013-06-12 DEATH — deceased

## 2013-07-08 LAB — MDC_IDC_ENUM_SESS_TYPE_REMOTE
Battery Impedance: 196 Ohm
Brady Statistic AP VS Percent: 0 %
Brady Statistic AS VP Percent: 18 %
Brady Statistic AS VS Percent: 0 %
Date Time Interrogation Session: 20150309133142
Lead Channel Impedance Value: 473 Ohm
Lead Channel Pacing Threshold Amplitude: 0.375 V
Lead Channel Pacing Threshold Pulse Width: 0.4 ms
Lead Channel Pacing Threshold Pulse Width: 0.4 ms
Lead Channel Setting Pacing Amplitude: 2.5 V
Lead Channel Setting Sensing Sensitivity: 4 mV
MDC IDC MSMT BATTERY REMAINING LONGEVITY: 97 mo
MDC IDC MSMT BATTERY VOLTAGE: 2.79 V
MDC IDC MSMT LEADCHNL RA IMPEDANCE VALUE: 432 Ohm
MDC IDC MSMT LEADCHNL RA SENSING INTR AMPL: 2.8 mV
MDC IDC MSMT LEADCHNL RV PACING THRESHOLD AMPLITUDE: 0.625 V
MDC IDC SET LEADCHNL RA PACING AMPLITUDE: 2 V
MDC IDC SET LEADCHNL RV PACING PULSEWIDTH: 0.4 ms
MDC IDC STAT BRADY AP VP PERCENT: 82 %

## 2014-02-20 ENCOUNTER — Encounter (HOSPITAL_COMMUNITY): Payer: Self-pay | Admitting: Internal Medicine

## 2015-02-03 ENCOUNTER — Encounter: Payer: Self-pay | Admitting: Cardiovascular Disease
